# Patient Record
Sex: Female | Born: 1985 | Race: Black or African American | Hispanic: No | Marital: Single | Smoking: Current every day smoker
Health system: Southern US, Community
[De-identification: ages and names within clinical notes are randomized; demographics above are authoritative.]

## PROBLEM LIST (undated history)

## (undated) ENCOUNTER — Emergency Department (HOSPITAL_BASED_OUTPATIENT_CLINIC_OR_DEPARTMENT_OTHER): Admission: EM | Source: Home / Self Care

---

## 2009-06-08 ENCOUNTER — Emergency Department (HOSPITAL_BASED_OUTPATIENT_CLINIC_OR_DEPARTMENT_OTHER): Admission: EM | Admit: 2009-06-08 | Discharge: 2009-06-08 | Payer: Self-pay | Admitting: Emergency Medicine

## 2009-10-31 ENCOUNTER — Emergency Department (HOSPITAL_BASED_OUTPATIENT_CLINIC_OR_DEPARTMENT_OTHER): Admission: EM | Admit: 2009-10-31 | Discharge: 2009-10-31 | Payer: Self-pay | Admitting: Emergency Medicine

## 2010-07-28 ENCOUNTER — Emergency Department (INDEPENDENT_AMBULATORY_CARE_PROVIDER_SITE_OTHER): Payer: Self-pay

## 2010-07-28 ENCOUNTER — Emergency Department (HOSPITAL_BASED_OUTPATIENT_CLINIC_OR_DEPARTMENT_OTHER)
Admission: EM | Admit: 2010-07-28 | Discharge: 2010-07-28 | Disposition: A | Discharge status: Home / Self Care | Payer: Self-pay | Attending: Emergency Medicine | Admitting: Emergency Medicine

## 2010-07-28 DIAGNOSIS — R0602 Shortness of breath: Secondary | ICD-10-CM | POA: Insufficient documentation

## 2010-07-28 DIAGNOSIS — R0609 Other forms of dyspnea: Secondary | ICD-10-CM | POA: Insufficient documentation

## 2010-07-28 DIAGNOSIS — R0989 Other specified symptoms and signs involving the circulatory and respiratory systems: Secondary | ICD-10-CM | POA: Insufficient documentation

## 2010-07-28 DIAGNOSIS — F17200 Nicotine dependence, unspecified, uncomplicated: Secondary | ICD-10-CM | POA: Insufficient documentation

## 2010-09-06 ENCOUNTER — Emergency Department (HOSPITAL_BASED_OUTPATIENT_CLINIC_OR_DEPARTMENT_OTHER)
Admission: EM | Admit: 2010-09-06 | Discharge: 2010-09-06 | Disposition: A | Discharge status: Home / Self Care | Payer: Self-pay | Attending: Emergency Medicine | Admitting: Emergency Medicine

## 2010-09-06 DIAGNOSIS — N760 Acute vaginitis: Secondary | ICD-10-CM | POA: Insufficient documentation

## 2010-09-06 DIAGNOSIS — A499 Bacterial infection, unspecified: Secondary | ICD-10-CM | POA: Insufficient documentation

## 2010-09-06 DIAGNOSIS — F17200 Nicotine dependence, unspecified, uncomplicated: Secondary | ICD-10-CM | POA: Insufficient documentation

## 2010-09-06 DIAGNOSIS — N390 Urinary tract infection, site not specified: Secondary | ICD-10-CM | POA: Insufficient documentation

## 2010-09-06 DIAGNOSIS — B9689 Other specified bacterial agents as the cause of diseases classified elsewhere: Secondary | ICD-10-CM | POA: Insufficient documentation

## 2010-09-06 LAB — URINALYSIS, ROUTINE W REFLEX MICROSCOPIC
Bilirubin Urine: NEGATIVE
Glucose, UA: NEGATIVE mg/dL
Nitrite: NEGATIVE
Specific Gravity, Urine: 1.024 (ref 1.005–1.030)
pH: 7 (ref 5.0–8.0)

## 2010-09-06 LAB — URINE MICROSCOPIC-ADD ON

## 2010-09-06 LAB — WET PREP, GENITAL

## 2010-09-06 LAB — PREGNANCY, URINE: Preg Test, Ur: NEGATIVE

## 2011-03-17 ENCOUNTER — Emergency Department (HOSPITAL_BASED_OUTPATIENT_CLINIC_OR_DEPARTMENT_OTHER)
Admission: EM | Admit: 2011-03-17 | Discharge: 2011-03-17 | Discharge status: Against Medical Advice | Payer: Self-pay | Attending: Emergency Medicine | Admitting: Emergency Medicine

## 2011-03-17 ENCOUNTER — Encounter: Payer: Self-pay | Admitting: *Deleted

## 2011-03-17 DIAGNOSIS — N898 Other specified noninflammatory disorders of vagina: Secondary | ICD-10-CM | POA: Insufficient documentation

## 2011-03-17 NOTE — ED Notes (Signed)
Pt presents to ED today with vaginal c/o for the last day.  Pt reports change in laudry detergent.  Pt tried OTC care with no relief in sx

## 2011-03-19 ENCOUNTER — Emergency Department (HOSPITAL_BASED_OUTPATIENT_CLINIC_OR_DEPARTMENT_OTHER)
Admission: EM | Admit: 2011-03-19 | Discharge: 2011-03-19 | Disposition: A | Discharge status: Home / Self Care | Payer: Self-pay | Attending: Emergency Medicine | Admitting: Emergency Medicine

## 2011-03-19 ENCOUNTER — Encounter (HOSPITAL_BASED_OUTPATIENT_CLINIC_OR_DEPARTMENT_OTHER): Payer: Self-pay | Admitting: *Deleted

## 2011-03-19 DIAGNOSIS — A499 Bacterial infection, unspecified: Secondary | ICD-10-CM | POA: Insufficient documentation

## 2011-03-19 DIAGNOSIS — B9689 Other specified bacterial agents as the cause of diseases classified elsewhere: Secondary | ICD-10-CM | POA: Insufficient documentation

## 2011-03-19 DIAGNOSIS — F17200 Nicotine dependence, unspecified, uncomplicated: Secondary | ICD-10-CM | POA: Insufficient documentation

## 2011-03-19 DIAGNOSIS — N760 Acute vaginitis: Secondary | ICD-10-CM | POA: Insufficient documentation

## 2011-03-19 LAB — URINALYSIS, ROUTINE W REFLEX MICROSCOPIC
Bilirubin Urine: NEGATIVE
Glucose, UA: NEGATIVE mg/dL
Hgb urine dipstick: NEGATIVE
Ketones, ur: NEGATIVE mg/dL
Protein, ur: NEGATIVE mg/dL
pH: 6 (ref 5.0–8.0)

## 2011-03-19 LAB — WET PREP, GENITAL
Trich, Wet Prep: NONE SEEN
Yeast Wet Prep HPF POC: NONE SEEN

## 2011-03-19 LAB — URINE MICROSCOPIC-ADD ON

## 2011-03-19 LAB — RPR: RPR Ser Ql: NONREACTIVE

## 2011-03-19 MED ORDER — METRONIDAZOLE 500 MG PO TABS
500.0000 mg | ORAL_TABLET | Freq: Once | ORAL | Status: AC
  Administered 2011-03-19: 500 mg via ORAL
  Filled 2011-03-19: qty 1

## 2011-03-19 NOTE — ED Provider Notes (Signed)
History     CSN: [id number]  Arrival date & time 03/19/11  0758   First MD Initiated Contact with Patient 03/19/11 [phone]      Chief Complaint  Patient presents with  . Vaginal Itching  . Urinary Tract Infection    (Consider location/radiation/quality/duration/timing/severity/associated sxs/prior treatment) HPI Comments: Pt c/o vaginal itching x 2 days.  Denies dysuria, hematuria, frequency and urgency.  No back pain or fever.  No vaginal d/c.  No prior STD's.  Denies being sexually active.  "i'm gay".  The history is provided by the patient. No language interpreter was used.    History reviewed. No pertinent past medical history.  History reviewed. No pertinent past surgical history.  History reviewed. No pertinent family history.  History  Substance Use Topics  . Smoking status: Current Everyday Smoker -- 0.5 packs/day    Types: Cigarettes  . Smokeless tobacco: Not on file  . Alcohol Use: Yes     pt reports occasional use    OB History    Grav Para Term Preterm Abortions TAB SAB Ect Mult Living                  Review of Systems  Genitourinary: Negative for dysuria, urgency, frequency, hematuria, flank pain, vaginal bleeding, vaginal discharge and vaginal pain.    Allergies  Review of patient's allergies indicates no known allergies.  Home Medications  No current outpatient prescriptions on file.  BP 119/79  Pulse 63  Temp(Src) 98.2 F (36.8 C) (Oral)  Resp 16  Ht 5\' 4"  (1.626 m)  Wt 200 lb (90.719 kg)  BMI 34.33 kg/m2  SpO2 100%  LMP 03/10/2011  Physical Exam  Nursing note and vitals reviewed. Constitutional: She is oriented to person, place, and time. She appears well-developed and well-nourished. No distress.  HENT:  Head: Normocephalic and atraumatic.  Eyes: EOM are normal.  Neck: Normal range of motion.  Cardiovascular: Normal rate, regular rhythm and normal heart sounds.   Pulmonary/Chest: Effort normal and breath sounds normal.    Abdominal: Soft. She exhibits no distension. There is no hepatosplenomegaly. There is tenderness. There is no rigidity, no rebound, no guarding, no CVA tenderness, no tenderness at McBurney's point and negative Murphy's sign.    Genitourinary: There is erythema around the vagina. No bleeding around the vagina. No foreign body around the vagina. No signs of injury around the vagina. No vaginal discharge found.       Labia minora appear red and irritated.  No d/c.  Pt used the clotrimazole cream internally last PM  Musculoskeletal: Normal range of motion.  Neurological: She is alert and oriented to person, place, and time.  Skin: Skin is warm and dry.  Psychiatric: She has a normal mood and affect. Judgment normal.    ED Course  Procedures (including critical care time)   Labs Reviewed  URINALYSIS, ROUTINE W REFLEX MICROSCOPIC  GC/CHLAMYDIA PROBE AMP, GENITAL  RPR  WET PREP, GENITAL   No results found.   No diagnosis found.    MDM          Worthy Rancher, PA 03/19/11 1020

## 2011-03-19 NOTE — ED Notes (Signed)
Pt c/o vaginal itching and painful urination x 1 week

## 2011-03-19 NOTE — ED Provider Notes (Signed)
Medical screening examination/treatment/procedure(s) were performed by non-physician practitioner and as supervising physician I was immediately available for consultation/collaboration.   Garrin Kirwan A. Donyetta Ogletree, MD 03/19/11 1528 

## 2011-08-19 ENCOUNTER — Encounter (HOSPITAL_BASED_OUTPATIENT_CLINIC_OR_DEPARTMENT_OTHER): Payer: Self-pay | Admitting: Emergency Medicine

## 2011-08-19 ENCOUNTER — Emergency Department (HOSPITAL_BASED_OUTPATIENT_CLINIC_OR_DEPARTMENT_OTHER): Payer: Self-pay

## 2011-08-19 ENCOUNTER — Emergency Department (HOSPITAL_BASED_OUTPATIENT_CLINIC_OR_DEPARTMENT_OTHER)
Admission: EM | Admit: 2011-08-19 | Discharge: 2011-08-19 | Disposition: A | Discharge status: Home / Self Care | Payer: Self-pay | Attending: Emergency Medicine | Admitting: Emergency Medicine

## 2011-08-19 DIAGNOSIS — M79609 Pain in unspecified limb: Secondary | ICD-10-CM | POA: Insufficient documentation

## 2011-08-19 DIAGNOSIS — S0512XA Contusion of eyeball and orbital tissues, left eye, initial encounter: Secondary | ICD-10-CM

## 2011-08-19 DIAGNOSIS — M79601 Pain in right arm: Secondary | ICD-10-CM

## 2011-08-19 DIAGNOSIS — S0510XA Contusion of eyeball and orbital tissues, unspecified eye, initial encounter: Secondary | ICD-10-CM | POA: Insufficient documentation

## 2011-08-19 DIAGNOSIS — R220 Localized swelling, mass and lump, head: Secondary | ICD-10-CM | POA: Insufficient documentation

## 2011-08-19 MED ORDER — HYDROCODONE-ACETAMINOPHEN 5-325 MG PO TABS
1.0000 | ORAL_TABLET | Freq: Once | ORAL | Status: AC
  Administered 2011-08-19: 1 via ORAL
  Filled 2011-08-19: qty 1

## 2011-08-19 MED ORDER — IBUPROFEN 800 MG PO TABS
800.0000 mg | ORAL_TABLET | Freq: Once | ORAL | Status: AC
  Administered 2011-08-19: 800 mg via ORAL
  Filled 2011-08-19: qty 1

## 2011-08-19 MED ORDER — HYDROCODONE-IBUPROFEN 7.5-200 MG PO TABS: 1.0000 | ORAL_TABLET | Freq: Four times a day (QID) | ORAL | Status: AC | PRN

## 2011-08-19 MED ORDER — ONDANSETRON 4 MG PO TBDP
ORAL_TABLET | ORAL | Status: AC
  Administered 2011-08-19: 4 mg via ORAL
  Filled 2011-08-19: qty 1

## 2011-08-19 MED ORDER — ONDANSETRON 4 MG PO TBDP: 4.0000 mg | ORAL_TABLET | Freq: Three times a day (TID) | ORAL | Status: AC | PRN

## 2011-08-19 MED ORDER — ONDANSETRON 4 MG PO TBDP
4.0000 mg | ORAL_TABLET | Freq: Once | ORAL | Status: AC
  Administered 2011-08-19: 4 mg via ORAL
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
## 2011-08-19 NOTE — ED Notes (Signed)
Patient given handful of crackers and coke

## 2011-08-19 NOTE — Discharge Instructions (Signed)
Contusion A contusion is a deep bruise. Contusions are the result of an injury that caused bleeding under the skin. The contusion may turn blue, purple, or yellow. Minor injuries will give you a painless contusion, but more severe contusions may stay painful and swollen for a few weeks.  CAUSES  A contusion is usually caused by a blow, trauma, or direct force to an area of the body. SYMPTOMS   Swelling and redness of the injured area.   Bruising of the injured area.   Tenderness and soreness of the injured area.   Pain.  DIAGNOSIS  The diagnosis can be made by taking a history and physical exam. An X-ray, CT scan, or MRI may be needed to determine if there were any associated injuries, such as fractures. TREATMENT  Specific treatment will depend on what area of the body was injured. In general, the best treatment for a contusion is resting, icing, elevating, and applying cold compresses to the injured area. Over-the-counter medicines may also be recommended for pain control. Ask your caregiver what the best treatment is for your contusion. HOME CARE INSTRUCTIONS   Put ice on the injured area.   Put ice in a plastic bag.   Place a towel between your skin and the bag.   Leave the ice on for 15 to 20 minutes, 3 to 4 times a day.   Only take over-the-counter or prescription medicines for pain, discomfort, or fever as directed by your caregiver. Your caregiver may recommend avoiding anti-inflammatory medicines (aspirin, ibuprofen, and naproxen) for 48 hours because these medicines may increase bruising.   Rest the injured area.   If possible, elevate the injured area to reduce swelling.  SEEK IMMEDIATE MEDICAL CARE IF:   You have increased bruising or swelling.   You have pain that is getting worse.   Your swelling or pain is not relieved with medicines.  MAKE SURE YOU:   Understand these instructions.   Will watch your condition.   Will get help right away if you are not  doing well or get worse.  Document Released: 12/14/2004 Document Revised: 02/23/2011 Document Reviewed: 01/09/2011 [facility] Patient Information 2012 [place], [place].Cryotherapy Cryotherapy means treatment with cold. Ice or gel packs can be used to reduce both pain and swelling. Ice is the most helpful within the first 24 to 48 hours after an injury or flareup from overusing a muscle or joint. Sprains, strains, spasms, burning pain, shooting pain, and aches can all be eased with ice. Ice can also be used when recovering from surgery. Ice is effective, has very few side effects, and is safe for most people to use. PRECAUTIONS  Ice is not a safe treatment option for people with:  Raynaud's phenomenon. This is a condition affecting small blood vessels in the extremities. Exposure to cold may cause your problems to return.   Cold hypersensitivity. There are many forms of cold hypersensitivity, including:   Cold urticaria. Red, itchy hives appear on the skin when the tissues begin to warm after being iced.   Cold erythema. This is a red, itchy rash caused by exposure to cold.   Cold hemoglobinuria. Red blood cells break down when the tissues begin to warm after being iced. The hemoglobin that carry oxygen are passed into the urine because they cannot combine with blood proteins fast enough.   Numbness or altered sensitivity in the area being iced.  If you have any of the following conditions, do not use ice until you have discussed  cryotherapy with your caregiver:  Heart conditions, such as arrhythmia, angina, or chronic heart disease.   High blood pressure.   Healing wounds or open skin in the area being iced.   Current infections.   Rheumatoid arthritis.   Poor circulation.   Diabetes.  Ice slows the blood flow in the region it is applied. This is beneficial when trying to stop inflamed tissues from spreading irritating chemicals to surrounding tissues. However, if you expose your skin  to cold temperatures for too long or without the proper protection, you can damage your skin or nerves. Watch for signs of skin damage due to cold. HOME CARE INSTRUCTIONS Follow these tips to use ice and cold packs safely.  Place a dry or damp towel between the ice and skin. A damp towel will cool the skin more quickly, so you may need to shorten the time that the ice is used.   For a more rapid response, add gentle compression to the ice.   Ice for no more than 10 to 20 minutes at a time. The bonier the area you are icing, the less time it will take to get the benefits of ice.   Check your skin after 5 minutes to make sure there are no signs of a poor response to cold or skin damage.   Rest 20 minutes or more in between uses.   Once your skin is numb, you can end your treatment. You can test numbness by very lightly touching your skin. The touch should be so light that you do not see the skin dimple from the pressure of your fingertip. When using ice, most people will feel these normal sensations in this order: cold, burning, aching, and numbness.   Do not use ice on someone who cannot communicate their responses to pain, such as small children or people with dementia.  HOW TO MAKE AN ICE PACK Ice packs are the most common way to use ice therapy. Other methods include ice massage, ice baths, and cryo-sprays. Muscle creams that cause a cold, tingly feeling do not offer the same benefits that ice offers and should not be used as a substitute unless recommended by your caregiver. To make an ice pack, do one of the following:  Place crushed ice or a bag of frozen vegetables in a sealable plastic bag. Squeeze out the excess air. Place this bag inside another plastic bag. Slide the bag into a pillowcase or place a damp towel between your skin and the bag.   Mix 3 parts water with 1 part rubbing alcohol. Freeze the mixture in a sealable plastic bag. When you remove the mixture from the freezer, it  will be slushy. Squeeze out the excess air. Place this bag inside another plastic bag. Slide the bag into a pillowcase or place a damp towel between your skin and the bag.  SEEK MEDICAL CARE IF:  You develop white spots on your skin. This may give the skin a blotchy (mottled) appearance.   Your skin turns blue or pale.   Your skin becomes waxy or hard.   Your swelling gets worse.  MAKE SURE YOU:   Understand these instructions.   Will watch your condition.   Will get help right away if you are not doing well or get worse.  Document Released: 10/31/2010 Document Revised: 02/23/2011 Document Reviewed: 10/31/2010 [facility] Patient Information 2012 [place], [place].Contusion A contusion is a deep bruise. Contusions are the result of an injury that caused bleeding under the  skin. The contusion may turn blue, purple, or yellow. Minor injuries will give you a painless contusion, but more severe contusions may stay painful and swollen for a few weeks.  CAUSES  A contusion is usually caused by a blow, trauma, or direct force to an area of the body. SYMPTOMS   Swelling and redness of the injured area.   Bruising of the injured area.   Tenderness and soreness of the injured area.   Pain.  DIAGNOSIS  The diagnosis can be made by taking a history and physical exam. An X-ray, CT scan, or MRI may be needed to determine if there were any associated injuries, such as fractures. TREATMENT  Specific treatment will depend on what area of the body was injured. In general, the best treatment for a contusion is resting, icing, elevating, and applying cold compresses to the injured area. Over-the-counter medicines may also be recommended for pain control. Ask your caregiver what the best treatment is for your contusion. HOME CARE INSTRUCTIONS   Put ice on the injured area.   Put ice in a plastic bag.   Place a towel between your skin and the bag.   Leave the ice on for 15 to 20 minutes, 3 to 4 times  a day.   Only take over-the-counter or prescription medicines for pain, discomfort, or fever as directed by your caregiver. Your caregiver may recommend avoiding anti-inflammatory medicines (aspirin, ibuprofen, and naproxen) for 48 hours because these medicines may increase bruising.   Rest the injured area.   If possible, elevate the injured area to reduce swelling.  SEEK IMMEDIATE MEDICAL CARE IF:   You have increased bruising or swelling.   You have pain that is getting worse.   Your swelling or pain is not relieved with medicines.  MAKE SURE YOU:   Understand these instructions.   Will watch your condition.   Will get help right away if you are not doing well or get worse.  Document Released: 12/14/2004 Document Revised: 02/23/2011 Document Reviewed: 01/09/2011 [facility] Patient Information 2012 [place], [place].    Take the meds as directed.  Take ibuprofen 800 mg every 8 hrswith food.  Apply ice several times daily.  Return if your symptoms worsen or change significantly.
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
## 2011-08-20 NOTE — ED Provider Notes (Signed)
Medical screening examination/treatment/procedure(s) were performed by non-physician practitioner and as supervising physician I was immediately available for consultation/collaboration.   Stephenia Vogan B. Bernette Mayers, MD 08/20/11 [phone]

## 2011-08-23 ENCOUNTER — Emergency Department (HOSPITAL_BASED_OUTPATIENT_CLINIC_OR_DEPARTMENT_OTHER)
Admission: EM | Admit: 2011-08-23 | Discharge: 2011-08-23 | Disposition: A | Discharge status: Home / Self Care | Payer: Self-pay | Attending: Emergency Medicine | Admitting: Emergency Medicine

## 2011-08-23 ENCOUNTER — Encounter (HOSPITAL_BASED_OUTPATIENT_CLINIC_OR_DEPARTMENT_OTHER): Payer: Self-pay | Admitting: Emergency Medicine

## 2011-08-23 DIAGNOSIS — A499 Bacterial infection, unspecified: Secondary | ICD-10-CM | POA: Insufficient documentation

## 2011-08-23 DIAGNOSIS — N760 Acute vaginitis: Secondary | ICD-10-CM | POA: Insufficient documentation

## 2011-08-23 DIAGNOSIS — B9689 Other specified bacterial agents as the cause of diseases classified elsewhere: Secondary | ICD-10-CM | POA: Insufficient documentation

## 2011-08-23 DIAGNOSIS — N390 Urinary tract infection, site not specified: Secondary | ICD-10-CM | POA: Insufficient documentation

## 2011-08-23 DIAGNOSIS — F17200 Nicotine dependence, unspecified, uncomplicated: Secondary | ICD-10-CM | POA: Insufficient documentation

## 2011-08-23 LAB — GC/CHLAMYDIA PROBE AMP, GENITAL
Chlamydia, DNA Probe: NEGATIVE
GC Probe Amp, Genital: NEGATIVE

## 2011-08-23 LAB — URINALYSIS, ROUTINE W REFLEX MICROSCOPIC
Bilirubin Urine: NEGATIVE
Hgb urine dipstick: NEGATIVE
Specific Gravity, Urine: 1.031 — ABNORMAL HIGH (ref 1.005–1.030)
pH: 5.5 (ref 5.0–8.0)

## 2011-08-23 LAB — WET PREP, GENITAL: Trich, Wet Prep: NONE SEEN

## 2011-08-23 LAB — URINE MICROSCOPIC-ADD ON

## 2011-08-23 MED ORDER — METRONIDAZOLE 500 MG PO TABS
500.0000 mg | ORAL_TABLET | Freq: Once | ORAL | Status: AC
  Administered 2011-08-23: 500 mg via ORAL
  Filled 2011-08-23: qty 1

## 2011-08-23 MED ORDER — NITROFURANTOIN MONOHYD MACRO 100 MG PO CAPS: 100.0000 mg | ORAL_CAPSULE | Freq: Two times a day (BID) | ORAL | Status: AC

## 2011-08-23 MED ORDER — NITROFURANTOIN MONOHYD MACRO 100 MG PO CAPS
100.0000 mg | ORAL_CAPSULE | Freq: Once | ORAL | Status: AC
  Administered 2011-08-23: 100 mg via ORAL
  Filled 2011-08-23: qty 1

## 2011-08-23 MED ORDER — METRONIDAZOLE 500 MG PO TABS: 500.0000 mg | ORAL_TABLET | Freq: Two times a day (BID) | ORAL | Status: AC
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
## 2011-08-23 NOTE — ED Notes (Signed)
Pt c/o lower abd pain and vaginal discharge.

## 2011-08-23 NOTE — ED Notes (Signed)
Seen here 08/19/11 and received rx for vicoprofen and zofran. Pt states she didn't get those prescriptions filled.

## 2011-08-24 LAB — URINE CULTURE: Culture  Setup Time: 201306050619

## 2011-09-05 ENCOUNTER — Emergency Department (HOSPITAL_BASED_OUTPATIENT_CLINIC_OR_DEPARTMENT_OTHER)
Admission: EM | Admit: 2011-09-05 | Discharge: 2011-09-05 | Disposition: A | Discharge status: Home / Self Care | Payer: Self-pay | Attending: Emergency Medicine | Admitting: Emergency Medicine

## 2011-09-05 ENCOUNTER — Encounter (HOSPITAL_BASED_OUTPATIENT_CLINIC_OR_DEPARTMENT_OTHER): Payer: Self-pay | Admitting: *Deleted

## 2011-09-05 DIAGNOSIS — R112 Nausea with vomiting, unspecified: Secondary | ICD-10-CM | POA: Insufficient documentation

## 2011-09-05 DIAGNOSIS — R110 Nausea: Secondary | ICD-10-CM

## 2011-09-05 DIAGNOSIS — F17200 Nicotine dependence, unspecified, uncomplicated: Secondary | ICD-10-CM | POA: Insufficient documentation

## 2011-09-05 MED ORDER — ONDANSETRON 4 MG PO TBDP
4.0000 mg | ORAL_TABLET | Freq: Once | ORAL | Status: AC
  Administered 2011-09-05: 4 mg via ORAL
  Filled 2011-09-05: qty 1

## 2011-09-05 MED ORDER — PROMETHAZINE HCL 25 MG PO TABS: 25.0000 mg | ORAL_TABLET | Freq: Four times a day (QID) | ORAL | Status: DC | PRN

## 2011-09-05 NOTE — Discharge Instructions (Signed)
Nausea and Vomiting  Nausea is a sick feeling that often comes before throwing up (vomiting). Vomiting is a reflex where stomach contents come out of your mouth. Vomiting can cause severe loss of body fluids (dehydration). Children and elderly adults can become dehydrated quickly, especially if they also have diarrhea. Nausea and vomiting are symptoms of a condition or disease. It is important to find the cause of your symptoms.  CAUSES    Direct irritation of the stomach lining. This irritation can result from increased acid production (gastroesophageal reflux disease), infection, food poisoning, taking certain medicines (such as nonsteroidal anti-inflammatory drugs), alcohol use, or tobacco use.   Signals from the brain.These signals could be caused by a headache, heat exposure, an inner ear disturbance, increased pressure in the brain from injury, infection, a tumor, or a concussion, pain, emotional stimulus, or metabolic problems.   An obstruction in the gastrointestinal tract (bowel obstruction).   Illnesses such as diabetes, hepatitis, gallbladder problems, appendicitis, kidney problems, cancer, sepsis, atypical symptoms of a heart attack, or eating disorders.   Medical treatments such as chemotherapy and radiation.   Receiving medicine that makes you sleep (general anesthetic) during surgery.  DIAGNOSIS  Your caregiver may ask for tests to be done if the problems do not improve after a few days. Tests may also be done if symptoms are severe or if the reason for the nausea and vomiting is not clear. Tests may include:   Urine tests.   Blood tests.   Stool tests.   Cultures (to look for evidence of infection).   X-rays or other imaging studies.  Test results can help your caregiver make decisions about treatment or the need for additional tests.  TREATMENT  You need to stay well hydrated. Drink frequently but in small amounts.You may wish to drink water, sports drinks, clear broth, or eat frozen  ice pops or gelatin dessert to help stay hydrated.When you eat, eating slowly may help prevent nausea.There are also some antinausea medicines that may help prevent nausea.  HOME CARE INSTRUCTIONS    Take all medicine as directed by your caregiver.   If you do not have an appetite, do not force yourself to eat. However, you must continue to drink fluids.   If you have an appetite, eat a normal diet unless your caregiver tells you differently.   Eat a variety of complex carbohydrates (rice, wheat, potatoes, bread), lean meats, yogurt, fruits, and vegetables.   Avoid high-fat foods because they are more difficult to digest.   Drink enough water and fluids to keep your urine clear or pale yellow.   If you are dehydrated, ask your caregiver for specific rehydration instructions. Signs of dehydration may include:   Severe thirst.   Dry lips and mouth.   Dizziness.   Dark urine.   Decreasing urine frequency and amount.   Confusion.   Rapid breathing or pulse.  SEEK IMMEDIATE MEDICAL CARE IF:    You have blood or brown flecks (like coffee grounds) in your vomit.   You have black or bloody stools.   You have a severe headache or stiff neck.   You are confused.   You have severe abdominal pain.   You have chest pain or trouble breathing.   You do not urinate at least once every 8 hours.   You develop cold or clammy skin.   You continue to vomit for longer than 24 to 48 hours.   You have a fever.  MAKE SURE YOU:      Understand these instructions.   Will watch your condition.   Will get help right away if you are not doing well or get worse.  Document Released: 03/06/2005 Document Revised: 02/23/2011 Document Reviewed: 08/03/2010  [employer] Patient Information 2012 [employer].

## 2011-09-05 NOTE — ED Provider Notes (Signed)
Medical screening examination/treatment/procedure(s) were performed by non-physician practitioner and as supervising physician I was immediately available for consultation/collaboration.    Nelia Shi, MD 09/05/11 1414

## 2011-09-05 NOTE — ED Provider Notes (Signed)
History     CSN: [id number]  Arrival date & time 09/05/11  1314   First MD Initiated Contact with Patient 09/05/11 1348      Chief Complaint  Patient presents with  . Nausea    (Consider location/radiation/quality/duration/timing/severity/associated sxs/prior treatment) Patient is a 26 y.o. female presenting with vomiting. The history is provided by the patient. No language interpreter was used.  Emesis  This is a new problem. The current episode started more than 2 days ago. The problem occurs 2 to 4 times per day. The problem has not changed since onset. Pt reports she becomes nauseated and feels like she is going to vomit after she takes flagyl and macrobid  History reviewed. No pertinent past medical history.  History reviewed. No pertinent past surgical history.  History reviewed. No pertinent family history.  History  Substance Use Topics  . Smoking status: Current Everyday Smoker -- 0.5 packs/day    Types: Cigarettes  . Smokeless tobacco: Not on file  . Alcohol Use: Yes     pt reports occasional use    OB History    Grav Para Term Preterm Abortions TAB SAB Ect Mult Living                  Review of Systems  Gastrointestinal: Positive for vomiting.  All other systems reviewed and are negative.    Allergies  Review of patient's allergies indicates no known allergies.  Home Medications  No current outpatient prescriptions on file.  BP 141/84  Pulse 65  Temp 97.7 F (36.5 C) (Oral)  Resp 16  Ht 5\' 4"  (1.626 m)  Wt 220 lb (99.791 kg)  BMI 37.76 kg/m2  SpO2 100%  LMP 07/28/2011  Physical Exam  Nursing note and vitals reviewed. Constitutional: She is oriented to person, place, and time. She appears well-developed and well-nourished.  HENT:  Head: Normocephalic and atraumatic.  Right Ear: External ear normal.  Left Ear: External ear normal.  Nose: Nose normal.  Mouth/Throat: Oropharynx is clear and moist.  Eyes: Conjunctivae and EOM are normal.  Pupils are equal, round, and reactive to light.  Neck: Normal range of motion. Neck supple.  Cardiovascular: Normal rate and normal heart sounds.   Pulmonary/Chest: Effort normal and breath sounds normal.  Abdominal: Soft. Bowel sounds are normal.  Neurological: She is alert and oriented to person, place, and time. She has normal reflexes.  Skin: Skin is warm.  Psychiatric: She has a normal mood and affect.    ED Course  Procedures (including critical care time)  Labs Reviewed - No data to display No results found.   1. Nausea       MDM  Pt given zofran here.  rx for phenergan.  Pt has 2 days left of medication.   I advised her that she needs to finish macrobid.        Lonia Skinner [place], [place] 09/05/11 1359  Lonia Skinner [place], [place] 09/05/11 1401

## 2011-09-05 NOTE — ED Notes (Signed)
Pt seen here a week ago, prescribed macrobid and flagyl, pt states when she takes meds , she gets nausea

## 2012-01-28 ENCOUNTER — Emergency Department (HOSPITAL_BASED_OUTPATIENT_CLINIC_OR_DEPARTMENT_OTHER)
Admission: EM | Admit: 2012-01-28 | Discharge: 2012-01-28 | Disposition: A | Discharge status: Home / Self Care | Payer: Self-pay | Attending: Emergency Medicine | Admitting: Emergency Medicine

## 2012-01-28 ENCOUNTER — Encounter (HOSPITAL_BASED_OUTPATIENT_CLINIC_OR_DEPARTMENT_OTHER): Payer: Self-pay

## 2012-01-28 DIAGNOSIS — A6000 Herpesviral infection of urogenital system, unspecified: Secondary | ICD-10-CM | POA: Insufficient documentation

## 2012-01-28 DIAGNOSIS — F17200 Nicotine dependence, unspecified, uncomplicated: Secondary | ICD-10-CM | POA: Insufficient documentation

## 2012-01-28 DIAGNOSIS — R300 Dysuria: Secondary | ICD-10-CM | POA: Insufficient documentation

## 2012-01-28 LAB — URINALYSIS, ROUTINE W REFLEX MICROSCOPIC
Bilirubin Urine: NEGATIVE
Leukocytes, UA: NEGATIVE
Nitrite: NEGATIVE
Specific Gravity, Urine: 1.034 — ABNORMAL HIGH (ref 1.005–1.030)
pH: 5.5 (ref 5.0–8.0)

## 2012-01-28 LAB — WET PREP, GENITAL
Trich, Wet Prep: NONE SEEN
Yeast Wet Prep HPF POC: NONE SEEN

## 2012-01-28 LAB — PREGNANCY, URINE: Preg Test, Ur: NEGATIVE

## 2012-01-28 MED ORDER — ACYCLOVIR 400 MG PO TABS: 400.0000 mg | ORAL_TABLET | Freq: Three times a day (TID) | ORAL | Status: DC

## 2012-01-28 NOTE — ED Notes (Signed)
Patient reports that she has painful swollen area to labia x 2 days, denies drainage. Also would like to be checked for diabetes, reports increased thirst and urination

## 2012-01-28 NOTE — ED Provider Notes (Signed)
History     CSN: [id number]  Arrival date & time 01/28/12  4098   First MD Initiated Contact with Patient 01/28/12 0430      Chief Complaint  Patient presents with  . possible abcess     (Consider location/radiation/quality/duration/timing/severity/associated sxs/prior treatment) Patient is a 26 y.o. female presenting with rash.  Rash  This is a new problem. The current episode started 2 days ago. The problem has been gradually worsening. The problem is associated with nothing. There has been no fever. Affected Location: labia. The pain is severe. The pain has been constant since onset. Associated symptoms include pain. Pertinent negatives include no itching. She has tried nothing for the symptoms. The treatment provided no relief.    History reviewed. No pertinent past medical history.  History reviewed. No pertinent past surgical history.  No family history on file.  History  Substance Use Topics  . Smoking status: Current Every Day Smoker -- 0.5 packs/day    Types: Cigarettes  . Smokeless tobacco: Not on file  . Alcohol Use: Yes     Comment: pt reports occasional use    OB History    Grav Para Term Preterm Abortions TAB SAB Ect Mult Living                  Review of Systems  Genitourinary: Positive for dysuria.  Skin: Positive for rash. Negative for itching.  All other systems reviewed and are negative.    Allergies  Review of patient's allergies indicates no known allergies.  Home Medications   Current Outpatient Rx  Name  Route  Sig  Dispense  Refill  . ACYCLOVIR 400 MG PO TABS   Oral   Take 1 tablet (400 mg total) by mouth 3 (three) times daily.   30 tablet   0     BP 134/92  Pulse 73  Temp 98.6 F (37 C) (Oral)  Resp 18  SpO2 100%  Physical Exam  Constitutional: She is oriented to person, place, and time. She appears well-developed and well-nourished.  HENT:  Head: Normocephalic and atraumatic.  Eyes: Pupils are equal, round, and  reactive to light.  Neck: Normal range of motion.  Pulmonary/Chest: Effort normal and breath sounds normal.  Abdominal: Soft. Bowel sounds are normal. There is no tenderness. There is no rebound.  Genitourinary: No vaginal discharge found.       Cluster of vesicles on the right labia.  Chaperone present.    Musculoskeletal: Normal range of motion.  Neurological: She is alert and oriented to person, place, and time.  Skin: Skin is warm and dry.       See GYN exam  Psychiatric: She has a normal mood and affect.    ED Course  Procedures (including critical care time)  Labs Reviewed  URINALYSIS, ROUTINE W REFLEX MICROSCOPIC - Abnormal; Notable for the following:    APPearance CLOUDY (*)     Specific Gravity, Urine 1.034 (*)     All other components within normal limits  WET PREP, GENITAL - Abnormal; Notable for the following:    WBC, Wet Prep HPF POC FEW (*)     All other components within normal limits  PREGNANCY, URINE  GC/CHLAMYDIA PROBE AMP   No results found.   1. Genital herpes       MDM  Vesicles c/w herpes.  No sexual activity.  Will treat for herpes [facility]        Mitchell Iwanicki K  Tylee Newby-Rasch, MD 01/28/12 [phone]

## 2012-01-29 LAB — GC/CHLAMYDIA PROBE AMP: CT Probe RNA: NEGATIVE

## 2012-03-23 ENCOUNTER — Emergency Department (HOSPITAL_BASED_OUTPATIENT_CLINIC_OR_DEPARTMENT_OTHER)
Admission: EM | Admit: 2012-03-23 | Discharge: 2012-03-23 | Disposition: A | Discharge status: Home / Self Care | Payer: Self-pay | Attending: Emergency Medicine | Admitting: Emergency Medicine

## 2012-03-23 ENCOUNTER — Encounter (HOSPITAL_BASED_OUTPATIENT_CLINIC_OR_DEPARTMENT_OTHER): Payer: Self-pay | Admitting: *Deleted

## 2012-03-23 ENCOUNTER — Emergency Department (HOSPITAL_BASED_OUTPATIENT_CLINIC_OR_DEPARTMENT_OTHER): Payer: Self-pay

## 2012-03-23 DIAGNOSIS — R51 Headache: Secondary | ICD-10-CM | POA: Insufficient documentation

## 2012-03-23 DIAGNOSIS — R42 Dizziness and giddiness: Secondary | ICD-10-CM | POA: Insufficient documentation

## 2012-03-23 DIAGNOSIS — Z79899 Other long term (current) drug therapy: Secondary | ICD-10-CM | POA: Insufficient documentation

## 2012-03-23 DIAGNOSIS — F17200 Nicotine dependence, unspecified, uncomplicated: Secondary | ICD-10-CM | POA: Insufficient documentation

## 2012-03-23 LAB — CBC WITH DIFFERENTIAL/PLATELET
Basophils Relative: 0 % (ref 0–1)
Eosinophils Absolute: 0 10*3/uL (ref 0.0–0.7)
Eosinophils Relative: 0 % (ref 0–5)
HCT: 39.9 % (ref 36.0–46.0)
Hemoglobin: 13.5 g/dL (ref 12.0–15.0)
Lymphs Abs: 2.3 10*3/uL (ref 0.7–4.0)
MCH: 29.3 pg (ref 26.0–34.0)
MCHC: 33.8 g/dL (ref 30.0–36.0)
MCV: 86.7 fL (ref 78.0–100.0)
Monocytes Absolute: 0.6 10*3/uL (ref 0.1–1.0)
Monocytes Relative: 7 % (ref 3–12)

## 2012-03-23 LAB — URINALYSIS, ROUTINE W REFLEX MICROSCOPIC
Bilirubin Urine: NEGATIVE
Ketones, ur: NEGATIVE mg/dL
Nitrite: NEGATIVE
Urobilinogen, UA: 0.2 mg/dL (ref 0.0–1.0)

## 2012-03-23 LAB — URINE MICROSCOPIC-ADD ON

## 2012-03-23 LAB — BASIC METABOLIC PANEL
BUN: 12 mg/dL (ref 6–23)
Creatinine, Ser: 0.8 mg/dL (ref 0.50–1.10)
GFR calc Af Amer: >90 mL/min (ref >90)
GFR calc non Af Amer: >90 mL/min (ref >90)
Glucose, Bld: 79 mg/dL (ref 70–99)

## 2012-03-23 MED ORDER — DIPHENHYDRAMINE HCL 50 MG/ML IJ SOLN
25.0000 mg | Freq: Once | INTRAMUSCULAR | Status: AC
  Administered 2012-03-23: 25 mg via INTRAVENOUS
  Filled 2012-03-23: qty 1

## 2012-03-23 MED ORDER — METOCLOPRAMIDE HCL 10 MG PO TABS: 10.0000 mg | ORAL_TABLET | Freq: Four times a day (QID) | ORAL | Status: DC | PRN

## 2012-03-23 MED ORDER — METOCLOPRAMIDE HCL 5 MG/ML IJ SOLN
10.0000 mg | Freq: Once | INTRAMUSCULAR | Status: AC
  Administered 2012-03-23: 10 mg via INTRAVENOUS
  Filled 2012-03-23: qty 2

## 2012-03-23 MED ORDER — SODIUM CHLORIDE 0.9 % IV BOLUS (SEPSIS)
1000.0000 mL | Freq: Once | INTRAVENOUS | Status: AC
  Administered 2012-03-23: 1000 mL via INTRAVENOUS

## 2012-03-23 NOTE — ED Notes (Signed)
Pt c/o h/a and dizziness x 2 weeks

## 2012-03-23 NOTE — ED Provider Notes (Signed)
History   This chart was scribed for Leah Booze, MD scribed by Magnus Sinning. The patient was seen in room MH03/[facility]03 at 15:41   CSN: [id number]  Arrival date & time 03/23/12  1507   First MD Initiated Contact with Patient 03/23/12 1539      Chief Complaint  Patient presents with  . Headache    (Consider location/radiation/quality/duration/timing/severity/associated sxs/prior treatment) Patient is a 27 y.o. female presenting with headaches. The history is provided by the patient. No language interpreter was used.  Headache  Pertinent negatives include no nausea and no vomiting.   Leah Good is a 27 y.o. female who presents to the Emergency Department complaining of gradually worsened moderate generalized HA with associated dizziness, both ongoing for 3-4 months.  The patient rates HA a 10 of 10 on pain scale and does not provide any aggravating or modifying factors. She states she's had one episode of emesis, but denies nausea. Also says that she has been sleeping moderately well, but does state HA has once woken her up.   The patient reports the dizziness is brought on when she stands up,and she provides she has taken ibuprofen and tylenol for HA with no relief. She states last night her eyes were hurting, and she notes an uncle that died that had similar sxs with hx of HTN, so she was concerned and wanted to present to ED for EVAL.  Patient states she smokes one pack a day, uses marijuana, and that she is an occasional drinker. The patient reports that she does not have sex with female partners, thus not on any birth control.   PCP: None History reviewed. No pertinent past medical history.  History reviewed. No pertinent past surgical history.  History reviewed. No pertinent family history.  History  Substance Use Topics  . Smoking status: Current Every Day Smoker -- 0.5 packs/day    Types: Cigarettes  . Smokeless tobacco: Not on file  . Alcohol Use: Yes     Comment: pt  reports occasional use    Review of Systems  Gastrointestinal: Negative for nausea and vomiting.  Neurological: Positive for dizziness and headaches.  All other systems reviewed and are negative.    Allergies  Review of patient's allergies indicates no known allergies.  Home Medications   Current Outpatient Rx  Name  Route  Sig  Dispense  Refill  . ACYCLOVIR 400 MG PO TABS   Oral   Take 1 tablet (400 mg total) by mouth 3 (three) times daily.   30 tablet   0     BP 161/85  Pulse 79  Temp 98.8 F (37.1 C) (Oral)  Resp 16  Ht 5\' 4"  (1.626 m)  Wt 206 lb (93.441 kg)  BMI 35.36 kg/m2  SpO2 99%  LMP 03/16/2012  Physical Exam  Nursing note and vitals reviewed. Constitutional: She is oriented to person, place, and time. She appears well-developed and well-nourished. No distress.  HENT:  Head: Normocephalic and atraumatic.  Eyes: Conjunctivae normal and EOM are normal. Pupils are equal, round, and reactive to light.       Fundi are nml.  Neck: Neck supple. No tracheal deviation present.  Cardiovascular: Normal rate, regular rhythm and normal heart sounds.   Pulmonary/Chest: Effort normal. No respiratory distress. She has no wheezes. She has no rales.  Abdominal: She exhibits no distension.  Musculoskeletal: Normal range of motion.  Neurological: She is alert and oriented to person, place, and time. No sensory deficit.  Skin: Skin is  warm and dry.  Psychiatric: She has a normal mood and affect. Her behavior is normal.    ED Course  Procedures (including critical care time) 15:45: Physical exam performed. 17:12: Recheck performed and patient notes headache has resolved after treatment.  Results for orders placed during the hospital encounter of 03/23/12  CBC WITH DIFFERENTIAL      Component Value Range   WBC 8.4  4.0 - 10.5 K/uL   RBC 4.60  3.87 - 5.11 MIL/uL   Hemoglobin 13.5  12.0 - 15.0 g/dL   HCT 11.9  14.7 - 82.9 %   MCV 86.7  78.0 - 100.0 fL   MCH 29.3  26.0  - 34.0 pg   MCHC 33.8  30.0 - 36.0 g/dL   RDW 56.2  13.0 - 86.5 %   Platelets 346  150 - 400 K/uL   Neutrophils Relative 66  43 - 77 %   Neutro Abs 5.6  1.7 - 7.7 K/uL   Lymphocytes Relative 27  12 - 46 %   Lymphs Abs 2.3  0.7 - 4.0 K/uL   Monocytes Relative 7  3 - 12 %   Monocytes Absolute 0.6  0.1 - 1.0 K/uL   Eosinophils Relative 0  0 - 5 %   Eosinophils Absolute 0.0  0.0 - 0.7 K/uL   Basophils Relative 0  0 - 1 %   Basophils Absolute 0.0  0.0 - 0.1 K/uL  BASIC METABOLIC PANEL      Component Value Range   Sodium 138  135 - 145 mEq/L   Potassium 3.5  3.5 - 5.1 mEq/L   Chloride 101  96 - 112 mEq/L   CO2 25  19 - 32 mEq/L   Glucose, Bld 79  70 - 99 mg/dL   BUN 12  6 - 23 mg/dL   Creatinine, Ser 7.84  0.50 - 1.10 mg/dL   Calcium 9.7  8.4 - 69.6 mg/dL   GFR calc non Af Amer >90  >90 mL/min   GFR calc Af Amer >90  >90 mL/min  URINALYSIS, ROUTINE W REFLEX MICROSCOPIC      Component Value Range   Color, Urine YELLOW  YELLOW   APPearance CLOUDY (*) CLEAR   Specific Gravity, Urine 1.027  1.005 - 1.030   pH 6.0  5.0 - 8.0   Glucose, UA NEGATIVE  NEGATIVE mg/dL   Hgb urine dipstick LARGE (*) NEGATIVE   Bilirubin Urine NEGATIVE  NEGATIVE   Ketones, ur NEGATIVE  NEGATIVE mg/dL   Protein, ur NEGATIVE  NEGATIVE mg/dL   Urobilinogen, UA 0.2  0.0 - 1.0 mg/dL   Nitrite NEGATIVE  NEGATIVE   Leukocytes, UA MODERATE (*) NEGATIVE  PREGNANCY, URINE      Component Value Range   Preg Test, Ur NEGATIVE  NEGATIVE  URINE MICROSCOPIC-ADD ON      Component Value Range   Squamous Epithelial / LPF MANY (*) RARE   WBC, UA 3-6  <3 WBC/hpf   RBC / HPF 7-10  <3 RBC/hpf   Bacteria, UA MANY (*) RARE   Ct Head Wo Contrast  03/23/2012  *RADIOLOGY REPORT*  Clinical Data:  Headache  CT HEAD WITHOUT CONTRAST  Technique:  Contiguous axial images were obtained from the base of the skull through the vertex without contrast  Comparison:  None.  Findings:  The brain has a normal appearance without evidence for  hemorrhage, acute infarction, hydrocephalus, or mass lesion.  There is no extra axial fluid collection.  The skull  and paranasal sinuses are normal.  IMPRESSION: Normal CT of the head without contrast.   Original Report Authenticated By: Janeece Riggers, M.D.      1. Headache       MDM  Headache of uncertain cause. The pattern seems generally benign. Although the patient claims a headache 10/10 severity, she is casually talking on the phone and asking if she can have something to eat and does not appear to be in any distress whatsoever. Mild to moderate hypertension is noted and will need to be followed with additional readings as an outpatient. She'll be given headache cocktail and reassessed. Because of how long the headache has been going on, CT scan will be obtained.  CT is unremarkable and she got excellent relief of her headache with IV fluids, IV metoclopramide, and IV diphenhydramine. She'll be sent home with a prescription for metoclopramide. Urinalysis has come back with an obviously contaminated specimen with many epithelial cells. She has no symptoms suggestive of urinary tract infection, so we'll not be put on antibiotics.  I personally performed the services described in this documentation, which was scribed in my presence. The recorded information has been reviewed and is accurate.          Leah Booze, MD 03/23/12 [phone]

## 2012-03-25 LAB — URINE CULTURE: Colony Count: 60000
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
## 2014-05-22 ENCOUNTER — Encounter (HOSPITAL_BASED_OUTPATIENT_CLINIC_OR_DEPARTMENT_OTHER): Payer: Self-pay

## 2014-05-22 ENCOUNTER — Emergency Department (HOSPITAL_BASED_OUTPATIENT_CLINIC_OR_DEPARTMENT_OTHER)
Admission: EM | Admit: 2014-05-22 | Discharge: 2014-05-22 | Disposition: A | Discharge status: Home / Self Care | Payer: Self-pay | Attending: Emergency Medicine | Admitting: Emergency Medicine

## 2014-05-22 DIAGNOSIS — N72 Inflammatory disease of cervix uteri: Secondary | ICD-10-CM | POA: Insufficient documentation

## 2014-05-22 DIAGNOSIS — Z720 Tobacco use: Secondary | ICD-10-CM | POA: Insufficient documentation

## 2014-05-22 DIAGNOSIS — Z79899 Other long term (current) drug therapy: Secondary | ICD-10-CM | POA: Insufficient documentation

## 2014-05-22 DIAGNOSIS — Z3202 Encounter for pregnancy test, result negative: Secondary | ICD-10-CM | POA: Insufficient documentation

## 2014-05-22 LAB — URINALYSIS, ROUTINE W REFLEX MICROSCOPIC
BILIRUBIN URINE: NEGATIVE
Glucose, UA: NEGATIVE mg/dL
KETONES UR: NEGATIVE mg/dL
Nitrite: NEGATIVE
PROTEIN: NEGATIVE mg/dL
Specific Gravity, Urine: 1.018 (ref 1.005–1.030)
UROBILINOGEN UA: 0.2 mg/dL (ref 0.0–1.0)
pH: 7 (ref 5.0–8.0)

## 2014-05-22 LAB — WET PREP, GENITAL
TRICH WET PREP: NONE SEEN
Yeast Wet Prep HPF POC: NONE SEEN

## 2014-05-22 LAB — PREGNANCY, URINE: PREG TEST UR: NEGATIVE

## 2014-05-22 LAB — URINE MICROSCOPIC-ADD ON

## 2014-05-22 MED ORDER — AZITHROMYCIN 250 MG PO TABS
1000.0000 mg | ORAL_TABLET | Freq: Once | ORAL | Status: AC
  Administered 2014-05-22: 1000 mg via ORAL
  Filled 2014-05-22: qty 4

## 2014-05-22 MED ORDER — LIDOCAINE HCL (PF) 1 % IJ SOLN
INTRAMUSCULAR | Status: AC
  Administered 2014-05-22: 5 mL
  Filled 2014-05-22: qty 5

## 2014-05-22 MED ORDER — CEFTRIAXONE SODIUM 250 MG IJ SOLR
250.0000 mg | Freq: Once | INTRAMUSCULAR | Status: AC
  Administered 2014-05-22: 250 mg via INTRAMUSCULAR
  Filled 2014-05-22: qty 250

## 2014-05-22 NOTE — ED Provider Notes (Signed)
CSN: [id number]     Arrival date & time 05/22/14  0430 History   First MD Initiated Contact with Patient 05/22/14 [phone]     Chief Complaint  Patient presents with  . Vaginal Itching     (Consider location/radiation/quality/duration/timing/severity/associated sxs/prior Treatment) HPI  This is a 29 year old female with a one-day history of vulvovaginal irritation. Symptoms are moderate and worse with urination. There is associated itching and abnormal odor and slight discharge. She denies vaginal bleeding, fever, chills, nausea, vomiting or diarrhea.  History reviewed. No pertinent past medical history. History reviewed. No pertinent past surgical history. No family history on file. History  Substance Use Topics  . Smoking status: Current Every Day Smoker -- 0.50 packs/day    Types: Cigarettes  . Smokeless tobacco: Not on file  . Alcohol Use: Yes     Comment: pt reports occasional use   OB History    No data available     Review of Systems  All other systems reviewed and are negative.   Allergies  Review of patient's allergies indicates no known allergies.  Home Medications   Prior to Admission medications   Medication Sig Start Date End Date Taking? Authorizing Provider  acyclovir (ZOVIRAX) 400 MG tablet Take 1 tablet (400 mg total) by mouth 3 (three) times daily. 01/28/12   April K Palumbo-Rasch, MD  metoCLOPramide (REGLAN) 10 MG tablet Take 1 tablet (10 mg total) by mouth every 6 (six) hours as needed (nausea or headache). 03/23/12   Dione Booze, MD   BP 139/86 mmHg  Pulse 82  Temp(Src) 98.1 F (36.7 C) (Oral)  Resp 20  Ht  (1.626 m)  Wt 220 lb (99.791 kg)  BMI 37.74 kg/m2  SpO2 98%  LMP 05/06/2014   Physical Exam  General: Well-developed, well-nourished female in no acute distress; appearance consistent with age of record HENT: normocephalic; atraumatic Eyes: pupils equal, round and reactive to light; extraocular muscles intact Neck: supple Heart: regular  rate and rhythm Lungs: clear to auscultation bilaterally Abdomen: soft; nondistended; nontender; no masses or hepatosplenomegaly; bowel sounds present GU: Mild mucosal erythema; purulent discharge per cervical os; cervix erythematous; no cervical motion tenderness; no adnexal tenderness Extremities: No deformity; full range of motion; pulses normal Neurologic: Awake, alert and oriented; motor function intact in all extremities and symmetric; no facial droop Skin: Warm and dry Psychiatric: Normal mood and affect    ED Course  Procedures (including critical care time)   MDM  Nursing notes and vitals signs, including pulse oximetry, reviewed.  Summary of this visit's results, reviewed by myself:  Labs:  Results for orders placed or performed during the hospital encounter of 05/22/14 (from the past 24 hour(s))  Urinalysis, Routine w reflex microscopic     Status: Abnormal   Collection Time: 05/22/14  4:55 AM  Result Value Ref Range   Color, Urine YELLOW YELLOW   APPearance CLOUDY (A) CLEAR   Specific Gravity, Urine 1.018 1.005 - 1.030   pH 7.0 5.0 - 8.0   Glucose, UA NEGATIVE NEGATIVE mg/dL   Hgb urine dipstick SMALL (A) NEGATIVE   Bilirubin Urine NEGATIVE NEGATIVE   Ketones, ur NEGATIVE NEGATIVE mg/dL   Protein, ur NEGATIVE NEGATIVE mg/dL   Urobilinogen, UA 0.2 0.0 - 1.0 mg/dL   Nitrite NEGATIVE NEGATIVE   Leukocytes, UA MODERATE (A) NEGATIVE  Pregnancy, urine     Status: None   Collection Time: 05/22/14  4:55 AM  Result Value Ref Range   Preg Test, Ur NEGATIVE NEGATIVE  Urine microscopic-add on     Status: Abnormal   Collection Time: 05/22/14  4:55 AM  Result Value Ref Range   Squamous Epithelial / LPF MANY (A) RARE   WBC, UA 3-6 <3 WBC/hpf   RBC / HPF 0-2 <3 RBC/hpf   Bacteria, UA MANY (A) RARE  Wet prep, genital     Status: Abnormal   Collection Time: 05/22/14  5:00 AM  Result Value Ref Range   Yeast Wet Prep HPF POC NONE SEEN NONE SEEN   Trich, Wet Prep NONE SEEN  NONE SEEN   Clue Cells Wet Prep HPF POC FEW (A) NONE SEEN   WBC, Wet Prep HPF POC MANY (A) NONE SEEN   5:37 AM Exam consistent with early cervicitis. Will treat presumptively for GC and chlamydia.    Hanley SeamenJohn L Kristalynn Coddington, MD 05/22/14 [phone]

## 2014-05-22 NOTE — Discharge Instructions (Signed)
Cervicitis °Cervicitis is a soreness and swelling (inflammation) of the cervix. Your cervix is located at the bottom of your uterus. It opens up to the vagina. °CAUSES  °· Sexually transmitted infections (STIs).   °· Allergic reaction.   °· Medicines or birth control devices that are put in the vagina.   °· Injury to the cervix.   °· Bacterial infections.   °RISK FACTORS °You are at greater risk if you: °· Have unprotected sexual intercourse. °· Have sexual intercourse with many partners. °· Began sexual intercourse at an early age. °· Have a history of STIs. °SYMPTOMS  °There may be no symptoms. If symptoms occur, they may include:  °· Gray, white, yellow, or bad-smelling vaginal discharge.   °· Pain or itching of the area outside the vagina.   °· Painful sexual intercourse.   °· Lower abdominal or lower back pain, especially during intercourse.   °· Frequent urination.   °· Abnormal vaginal bleeding between periods, after sexual intercourse, or after menopause.   °· Pressure or a heavy feeling in the pelvis.   °DIAGNOSIS  °Diagnosis is made after a pelvic exam. Other tests may include:  °· Examination of any discharge under a microscope (wet prep).   °· A Pap test.   °TREATMENT  °Treatment will depend on the cause of cervicitis. If it is caused by an STI, both you and your partner will need to be treated. Antibiotic medicines will be given.  °HOME CARE INSTRUCTIONS  °· Do not have sexual intercourse until your health care provider says it is okay.   °· Do not have sexual intercourse until your partner has been treated, if your cervicitis is caused by an STI.   °· Take your antibiotics as directed. Finish them even if you start to feel better.   °SEEK MEDICAL CARE IF: °· Your symptoms come back.   °· You have a fever.   °MAKE SURE YOU:  °· Understand these instructions. °· Will watch your condition. °· Will get help right away if you are not doing well or get worse. °Document Released: 03/06/2005 Document Revised:  03/11/2013 Document Reviewed: 08/28/2012 °ExitCare® Patient Information ©2015 [employer]. This information is not intended to replace advice given to you by your health care provider. Make sure you discuss any questions you have with your health care provider. ° °

## 2014-05-22 NOTE — ED Notes (Signed)
Pt c/o vaginal irritating since yesterday, with itching, burning on urination with an odor; had un protective intercourse a wk ago.

## 2014-05-25 LAB — GC/CHLAMYDIA PROBE AMP (~~LOC~~) NOT AT ARMC
CHLAMYDIA, DNA PROBE: NEGATIVE
NEISSERIA GONORRHEA: NEGATIVE

## 2014-11-27 ENCOUNTER — Emergency Department (HOSPITAL_BASED_OUTPATIENT_CLINIC_OR_DEPARTMENT_OTHER)
Admission: EM | Admit: 2014-11-27 | Discharge: 2014-11-28 | Disposition: A | Discharge status: Home / Self Care | Payer: Self-pay | Attending: Emergency Medicine | Admitting: Emergency Medicine

## 2014-11-27 ENCOUNTER — Encounter (HOSPITAL_BASED_OUTPATIENT_CLINIC_OR_DEPARTMENT_OTHER): Payer: Self-pay | Admitting: *Deleted

## 2014-11-27 DIAGNOSIS — Z79899 Other long term (current) drug therapy: Secondary | ICD-10-CM | POA: Insufficient documentation

## 2014-11-27 DIAGNOSIS — Z720 Tobacco use: Secondary | ICD-10-CM | POA: Insufficient documentation

## 2014-11-27 DIAGNOSIS — Z3202 Encounter for pregnancy test, result negative: Secondary | ICD-10-CM | POA: Insufficient documentation

## 2014-11-27 DIAGNOSIS — N72 Inflammatory disease of cervix uteri: Secondary | ICD-10-CM

## 2014-11-27 LAB — URINE MICROSCOPIC-ADD ON

## 2014-11-27 LAB — URINALYSIS, ROUTINE W REFLEX MICROSCOPIC
BILIRUBIN URINE: NEGATIVE
GLUCOSE, UA: NEGATIVE mg/dL
KETONES UR: NEGATIVE mg/dL
Nitrite: NEGATIVE
PH: 7 (ref 5.0–8.0)
Protein, ur: NEGATIVE mg/dL
Specific Gravity, Urine: 1.028 (ref 1.005–1.030)
Urobilinogen, UA: 1 mg/dL (ref 0.0–1.0)

## 2014-11-27 LAB — PREGNANCY, URINE: Preg Test, Ur: NEGATIVE

## 2014-11-27 NOTE — ED Notes (Signed)
Abdominal pain. Vaginal discharge.

## 2014-11-28 ENCOUNTER — Encounter (HOSPITAL_BASED_OUTPATIENT_CLINIC_OR_DEPARTMENT_OTHER): Payer: Self-pay | Admitting: Emergency Medicine

## 2014-11-28 LAB — WET PREP, GENITAL
CLUE CELLS WET PREP: NONE SEEN
TRICH WET PREP: NONE SEEN

## 2014-11-28 MED ORDER — KETOROLAC TROMETHAMINE 60 MG/2ML IM SOLN
60.0000 mg | Freq: Once | INTRAMUSCULAR | Status: AC
  Administered 2014-11-28: 60 mg via INTRAMUSCULAR
  Filled 2014-11-28: qty 2

## 2014-11-28 MED ORDER — LIDOCAINE HCL (PF) 1 % IJ SOLN
INTRAMUSCULAR | Status: AC
  Administered 2014-11-28: 02:00:00
  Filled 2014-11-28: qty 5

## 2014-11-28 MED ORDER — CEFTRIAXONE SODIUM 250 MG IJ SOLR
250.0000 mg | Freq: Once | INTRAMUSCULAR | Status: AC
  Administered 2014-11-28: 250 mg via INTRAMUSCULAR
  Filled 2014-11-28: qty 250

## 2014-11-28 MED ORDER — AZITHROMYCIN 1 G PO PACK
1.0000 g | PACK | Freq: Once | ORAL | Status: AC
  Administered 2014-11-28: 1 g via ORAL
  Filled 2014-11-28: qty 1

## 2014-11-28 NOTE — Discharge Instructions (Signed)
Cervicitis °Cervicitis is a soreness and puffiness (inflammation) of the cervix.  °HOME CARE °· Do not have sex (intercourse) until your doctor says it is okay. °· Do not have sex until your partner is treated or as told by your doctor. °· Take your antibiotic medicine as told. Finish it even if you start to feel better. °GET HELP IF:  °· Your symptoms that brought you to the doctor come back. °· You have a fever. °MAKE SURE YOU:  °· Understand these instructions. °· Will watch your condition. °· Will get help right away if you are not doing well or get worse. °Document Released: 12/14/2007 Document Revised: 03/11/2013 Document Reviewed: 08/28/2012 °ExitCare® Patient Information ©2015 [employer]. This information is not intended to replace advice given to you by your health care provider. Make sure you discuss any questions you have with your health care provider. ° °

## 2014-11-28 NOTE — ED Notes (Signed)
Pt reports vaginal discharge and itching since last night 11/26/14.

## 2014-11-28 NOTE — ED Provider Notes (Signed)
CSN: [id number]     Arrival date & time 11/27/14  2124 History   First MD Initiated Contact with Patient 11/28/14 0022     Chief Complaint  Patient presents with  . Abdominal Pain     (Consider location/radiation/quality/duration/timing/severity/associated sxs/prior Treatment) Patient is a 29 y.o. female presenting with vaginal discharge. The history is provided by the patient.  Vaginal Discharge Quality:  Unable to specify Severity:  Moderate Onset quality:  Gradual Timing:  Constant Progression:  Unchanged Chronicity:  New Context: at rest   Relieved by:  Nothing Worsened by:  Nothing tried Ineffective treatments:  None tried Associated symptoms: vaginal itching   Associated symptoms: no genital lesions   Risk factors: unprotected sex     History reviewed. No pertinent past medical history. History reviewed. No pertinent past surgical history. History reviewed. No pertinent family history. Social History  Substance Use Topics  . Smoking status: Current Every Day Smoker -- 0.50 packs/day    Types: Cigarettes  . Smokeless tobacco: None  . Alcohol Use: Yes     Comment: pt reports occasional use   OB History    No data available     Review of Systems  Genitourinary: Positive for vaginal discharge.  All other systems reviewed and are negative.     Allergies  Review of patient's allergies indicates no known allergies.  Home Medications   Prior to Admission medications   Medication Sig Start Date End Date Taking? Authorizing Provider  acyclovir (ZOVIRAX) 400 MG tablet Take 1 tablet (400 mg total) by mouth 3 (three) times daily. 01/28/12   Janasia Coverdale, MD  metoCLOPramide (REGLAN) 10 MG tablet Take 1 tablet (10 mg total) by mouth every 6 (six) hours as needed (nausea or headache). 03/23/12   Dione Booze, MD   BP 156/98 mmHg  Pulse 74  Temp(Src) 98.7 F (37.1 C) (Oral)  Resp 18  Ht  (1.626 m)  Wt 220 lb (99.791 kg)  BMI 37.74 kg/m2  SpO2 98%  LMP  11/20/2014 Physical Exam  Constitutional: She is oriented to person, place, and time. She appears well-developed and well-nourished. No distress.  HENT:  Head: Normocephalic and atraumatic.  Mouth/Throat: Oropharynx is clear and moist.  Eyes: Conjunctivae are normal. Pupils are equal, round, and reactive to light.  Neck: Normal range of motion. Neck supple.  Cardiovascular: Normal rate, regular rhythm and intact distal pulses.   Pulmonary/Chest: Effort normal and breath sounds normal. No respiratory distress. She has no wheezes. She has no rales.  Abdominal: Soft. Bowel sounds are normal. There is no tenderness. There is no rebound and no guarding.  Genitourinary: Cervix exhibits motion tenderness and discharge. Vaginal discharge found.  Chaperone present, brownish gray discharge moderate mild CMT  Musculoskeletal: Normal range of motion.  Neurological: She is alert and oriented to person, place, and time.  Skin: Skin is warm and dry.  Psychiatric: She has a normal mood and affect.    ED Course  Procedures (including critical care time) Labs Review Labs Reviewed  URINALYSIS, ROUTINE W REFLEX MICROSCOPIC (NOT AT [facility]) - Abnormal; Notable for the following:    APPearance CLOUDY (*)    Hgb urine dipstick TRACE (*)    Leukocytes, UA MODERATE (*)    All other components within normal limits  WET PREP, GENITAL  PREGNANCY, URINE  URINE MICROSCOPIC-ADD ON  GC/CHLAMYDIA PROBE AMP ([place]) NOT AT [facility]    Imaging Review No results found. I have personally reviewed and evaluated these images and  lab results as part of my medical decision-making.   EKG Interpretation None      MDM   Final diagnoses:  None    Wet prep and exam along with history are highly suspicious for STI, will treat for GC and chlamydia.  No sexual activity for 7 days until  all partners are treated.  Patient verbalizes understanding     Leilana Mcquire, MD 11/28/14 [phone]

## 2014-11-30 LAB — GC/CHLAMYDIA PROBE AMP (~~LOC~~) NOT AT ARMC
CHLAMYDIA, DNA PROBE: NEGATIVE
NEISSERIA GONORRHEA: NEGATIVE

## 2017-06-11 ENCOUNTER — Encounter (HOSPITAL_BASED_OUTPATIENT_CLINIC_OR_DEPARTMENT_OTHER): Payer: Self-pay | Admitting: Emergency Medicine

## 2017-06-11 ENCOUNTER — Other Ambulatory Visit: Payer: Self-pay

## 2017-06-11 ENCOUNTER — Emergency Department (HOSPITAL_BASED_OUTPATIENT_CLINIC_OR_DEPARTMENT_OTHER)
Admission: EM | Admit: 2017-06-11 | Discharge: 2017-06-11 | Disposition: A | Discharge status: Home / Self Care | Payer: Self-pay | Attending: Emergency Medicine | Admitting: Emergency Medicine

## 2017-06-11 DIAGNOSIS — H6692 Otitis media, unspecified, left ear: Secondary | ICD-10-CM | POA: Insufficient documentation

## 2017-06-11 DIAGNOSIS — Z79899 Other long term (current) drug therapy: Secondary | ICD-10-CM | POA: Insufficient documentation

## 2017-06-11 DIAGNOSIS — H6982 Other specified disorders of Eustachian tube, left ear: Secondary | ICD-10-CM | POA: Insufficient documentation

## 2017-06-11 DIAGNOSIS — F17210 Nicotine dependence, cigarettes, uncomplicated: Secondary | ICD-10-CM | POA: Insufficient documentation

## 2017-06-11 MED ORDER — FLUTICASONE PROPIONATE 50 MCG/ACT NA SUSP: 2.0000 | Freq: Every day | NASAL | 0 refills | Status: DC

## 2017-06-11 MED ORDER — AMOXICILLIN 500 MG PO CAPS: 500.0000 mg | ORAL_CAPSULE | Freq: Two times a day (BID) | ORAL | 0 refills | Status: AC

## 2017-06-11 MED ORDER — CETIRIZINE HCL 10 MG PO TABS: 10.0000 mg | ORAL_TABLET | Freq: Every day | ORAL | 0 refills | Status: DC

## 2017-06-11 NOTE — Discharge Instructions (Signed)
Please take full course of antibiotics as directed.  Please also use Zyrtec and Flonase to help with nasal congestion.  Follow-up with [facility] for recheck.  Please return to the ED if you began having significantly worsening pain in the ear, persistent fevers, headache and neck pain or any other new or concerning symptoms.

## 2017-06-11 NOTE — ED Provider Notes (Signed)
[facility] EMERGENCY DEPARTMENT Provider Note   CSN: [id number] Arrival date & time: 06/11/17  1625     History   Chief Complaint Chief Complaint  Patient presents with  . Otalgia    HPI Leah Good is a 32 y.o. female.  Leah Good is a 32 y.o. Female who is otherwise healthy, presents to the ED for evaluation of left ear pain and difficulty hearing for the past week.  Patient reports pain is intermittent, throbbing in nature, and hearing sounds muffled.  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  Patient denies any fevers or chills, no drainage from the ear.  She does report she has had some nasal congestion and rhinorrhea, think she may have some seasonal allergies.  She has not taken any medications to treat her symptoms.  She denies any cough, no headaches or neck pain.  She denies any other aggravating or alleviating factors.     History reviewed. No pertinent past medical history.  There are no active problems to display for this patient.   History reviewed. No pertinent surgical history.   OB History   None      Home Medications    Prior to Admission medications   Medication Sig Start Date End Date Taking? Authorizing Provider  acyclovir (ZOVIRAX) 400 MG tablet Take 1 tablet (400 mg total) by mouth 3 (three) times daily. 01/28/12   Palumbo, April, MD  metoCLOPramide (REGLAN) 10 MG tablet Take 1 tablet (10 mg total) by mouth every 6 (six) hours as needed (nausea or headache). 03/23/12   Dione BoozeGlick, David, MD    Family History History reviewed. No pertinent family history.  Social History Social History   Tobacco Use  . Smoking status: Current Every Day Smoker    Packs/day: 0.50    Types: Cigarettes  Substance Use Topics  . Alcohol use: Yes    Comment: pt reports occasional use  . Drug use: Yes    Types: Marijuana     Allergies   Patient has no known allergies.   Review  of Systems Review of Systems  Constitutional: Negative for chills and fever.  HENT: Positive for congestion, ear pain, hearing loss, postnasal drip and sinus pressure. Negative for ear discharge and tinnitus.   Respiratory: Negative for cough and shortness of breath.   Cardiovascular: Negative for chest pain.  Musculoskeletal: Negative for neck pain and neck stiffness.  Skin: Negative for color change and rash.  Neurological: Negative for dizziness, light-headedness and headaches.     Physical Exam Updated Vital Signs BP (!) 141/91 (BP Location: Right Arm)   Pulse 70   Temp 98.3 F (36.8 C) (Oral)   Resp 18   Ht 5\' 4"  (1.626 m)   Wt 103.8 kg (228 lb 13.4 oz)   LMP 06/10/2017 (Approximate)   SpO2 100%   BMI 39.28 kg/m   Physical Exam  Constitutional: She appears well-developed and well-nourished. No distress.  HENT:  Head: Normocephalic and atraumatic.  Left TM erythematous and bulging with air-fluid level, EAC clear and unremarkable, no mastoid erythema or tenderness.  Right TM unremarkable.  Mild nasal mucosal edema and congestion.  Posterior oropharynx clear and moist.  Eyes: Right eye exhibits no discharge. Left eye exhibits no discharge.  Neck: Neck supple.  No rigidity  Cardiovascular: Normal rate, regular rhythm and normal heart sounds.  Pulmonary/Chest: Effort normal and breath sounds normal. No respiratory distress.  Lymphadenopathy:  She has no cervical adenopathy.  Neurological: She is alert. Coordination normal.  Skin: She is not diaphoretic.  Psychiatric: She has a normal mood and affect. Her behavior is normal.  Nursing note and vitals reviewed.    ED Treatments / Results  Labs (all labs ordered are listed, but only abnormal results are displayed) Labs Reviewed - No data to display  EKG None  Radiology No results found.  Procedures Procedures (including critical care time)  Medications Ordered in ED Medications - No data to  display   Initial Impression / Assessment and Plan / ED Course  I have reviewed the triage vital signs and the nursing notes.  Pertinent labs & imaging results that were available during my care of the patient were reviewed by me and considered in my medical decision making (see chart for details).  Pt presents to the ED for 1 week of left ear pain and hearing patient reports symptoms in the left ear, no drainage.  Patient also reports some nasal congestion.. On exam patient with normal vitals, consistent with left otitis media, no evidence of mastoiditis.  No concern for meningitis.  Will treat with amoxicillin, patient also likely has some degree of eustachian tube dysfunction and congestion.  Will have patient begin taking Zyrtec and Flonase daily.  Patient expresses understanding and agreement with plan.  Return precautions discussed.  Patient to follow-up with your primary doctor.   Final Clinical Impressions(s) / ED Diagnoses   Final diagnoses:  Left otitis media, unspecified otitis media type  Dysfunction of left eustachian tube    ED Discharge Orders        Ordered    amoxicillin (AMOXIL) 500 MG capsule  2 times daily     06/11/17 2109    fluticasone (FLONASE) 50 MCG/ACT nasal spray  Daily     06/11/17 2109    cetirizine (ZYRTEC ALLERGY) 10 MG tablet  Daily     06/11/17 2109       Dartha Lodge, PA-C 06/12/17 1610    Rolland Porter, MD 06/12/17 1520

## 2017-06-11 NOTE — ED Triage Notes (Signed)
Patient reports left ear pain with difficulty hearing which began last week.

## 2021-01-03 ENCOUNTER — Encounter (HOSPITAL_BASED_OUTPATIENT_CLINIC_OR_DEPARTMENT_OTHER): Payer: Self-pay | Admitting: Emergency Medicine

## 2021-01-03 ENCOUNTER — Other Ambulatory Visit: Payer: Self-pay

## 2021-01-03 ENCOUNTER — Emergency Department (HOSPITAL_BASED_OUTPATIENT_CLINIC_OR_DEPARTMENT_OTHER)
Admission: EM | Admit: 2021-01-03 | Discharge: 2021-01-03 | Disposition: A | Discharge status: Home / Self Care | Payer: Medicaid - Out of State | Attending: Emergency Medicine | Admitting: Emergency Medicine

## 2021-01-03 ENCOUNTER — Other Ambulatory Visit (HOSPITAL_BASED_OUTPATIENT_CLINIC_OR_DEPARTMENT_OTHER): Payer: Self-pay

## 2021-01-03 DIAGNOSIS — N760 Acute vaginitis: Secondary | ICD-10-CM | POA: Diagnosis not present

## 2021-01-03 DIAGNOSIS — B9689 Other specified bacterial agents as the cause of diseases classified elsewhere: Secondary | ICD-10-CM | POA: Diagnosis not present

## 2021-01-03 DIAGNOSIS — R109 Unspecified abdominal pain: Secondary | ICD-10-CM

## 2021-01-03 DIAGNOSIS — R110 Nausea: Secondary | ICD-10-CM | POA: Insufficient documentation

## 2021-01-03 DIAGNOSIS — R1030 Lower abdominal pain, unspecified: Secondary | ICD-10-CM | POA: Diagnosis present

## 2021-01-03 DIAGNOSIS — F17210 Nicotine dependence, cigarettes, uncomplicated: Secondary | ICD-10-CM | POA: Diagnosis not present

## 2021-01-03 LAB — URINALYSIS, ROUTINE W REFLEX MICROSCOPIC
Bilirubin Urine: NEGATIVE
Glucose, UA: NEGATIVE mg/dL
Hgb urine dipstick: NEGATIVE
Ketones, ur: NEGATIVE mg/dL
Nitrite: NEGATIVE
Protein, ur: NEGATIVE mg/dL
Specific Gravity, Urine: 1.025 (ref 1.005–1.030)
pH: 6 (ref 5.0–8.0)

## 2021-01-03 LAB — COMPREHENSIVE METABOLIC PANEL
ALT: 18 U/L (ref 0–44)
AST: 18 U/L (ref 15–41)
Albumin: 3.6 g/dL (ref 3.5–5.0)
Alkaline Phosphatase: 74 U/L (ref 38–126)
Anion gap: 5 (ref 5–15)
BUN: 12 mg/dL (ref 6–20)
CO2: 26 mmol/L (ref 22–32)
Calcium: 9.2 mg/dL (ref 8.9–10.3)
Chloride: 104 mmol/L (ref 98–111)
Creatinine, Ser: 0.8 mg/dL (ref 0.44–1.00)
GFR, Estimated: >60 mL/min (ref >60)
Glucose, Bld: 102 mg/dL — ABNORMAL HIGH (ref 70–99)
Potassium: 3.6 mmol/L (ref 3.5–5.1)
Sodium: 135 mmol/L (ref 135–145)
Total Bilirubin: 0.4 mg/dL (ref 0.3–1.2)
Total Protein: 7.4 g/dL (ref 6.5–8.1)

## 2021-01-03 LAB — CBC WITH DIFFERENTIAL/PLATELET
Abs Immature Granulocytes: 0.02 10*3/uL (ref 0.00–0.07)
Basophils Absolute: 0 10*3/uL (ref 0.0–0.1)
Basophils Relative: 0 %
Eosinophils Absolute: 0.1 10*3/uL (ref 0.0–0.5)
Eosinophils Relative: 1 %
HCT: 40.7 % (ref 36.0–46.0)
Hemoglobin: 13.3 g/dL (ref 12.0–15.0)
Immature Granulocytes: 0 %
Lymphocytes Relative: 25 %
Lymphs Abs: 1.7 10*3/uL (ref 0.7–4.0)
MCH: 29.8 pg (ref 26.0–34.0)
MCHC: 32.7 g/dL (ref 30.0–36.0)
MCV: 91.3 fL (ref 80.0–100.0)
Monocytes Absolute: 0.6 10*3/uL (ref 0.1–1.0)
Monocytes Relative: 9 %
Neutro Abs: 4.4 10*3/uL (ref 1.7–7.7)
Neutrophils Relative %: 65 %
Platelets: 283 10*3/uL (ref 150–400)
RBC: 4.46 MIL/uL (ref 3.87–5.11)
RDW: 13.1 % (ref 11.5–15.5)
WBC: 6.8 10*3/uL (ref 4.0–10.5)
nRBC: 0 % (ref 0.0–0.2)

## 2021-01-03 LAB — WET PREP, GENITAL
Sperm: NONE SEEN
Trich, Wet Prep: NONE SEEN
Yeast Wet Prep HPF POC: NONE SEEN

## 2021-01-03 LAB — URINALYSIS, MICROSCOPIC (REFLEX)

## 2021-01-03 LAB — PREGNANCY, URINE: Preg Test, Ur: NEGATIVE

## 2021-01-03 MED ORDER — LIDOCAINE HCL (PF) 1 % IJ SOLN
1.0000 mL | Freq: Once | INTRAMUSCULAR | Status: AC
  Administered 2021-01-03: 1 mL
  Filled 2021-01-03: qty 5

## 2021-01-03 MED ORDER — AZITHROMYCIN 250 MG PO TABS
1000.0000 mg | ORAL_TABLET | Freq: Once | ORAL | Status: AC
  Administered 2021-01-03: 1000 mg via ORAL
  Filled 2021-01-03: qty 4

## 2021-01-03 MED ORDER — ACETAMINOPHEN 325 MG PO TABS
650.0000 mg | ORAL_TABLET | Freq: Once | ORAL | Status: AC
  Administered 2021-01-03: 650 mg via ORAL
  Filled 2021-01-03: qty 2

## 2021-01-03 MED ORDER — CEFTRIAXONE SODIUM 500 MG IJ SOLR
500.0000 mg | Freq: Once | INTRAMUSCULAR | Status: AC
  Administered 2021-01-03: 500 mg via INTRAMUSCULAR
  Filled 2021-01-03: qty 500

## 2021-01-03 MED ORDER — METRONIDAZOLE 500 MG PO TABS
500.0000 mg | ORAL_TABLET | Freq: Two times a day (BID) | ORAL | 0 refills | Status: DC
  Filled 2021-01-03: qty 14, 7d supply, fill #0

## 2021-01-03 NOTE — Discharge Instructions (Addendum)
Do not have sex until your test results have returned negative. Call your primary care doctor or specialist as discussed in the next 2-3 days.   Return immediately back to the ER if:  Your symptoms worsen within the next 12-24 hours. You develop new symptoms such as new fevers, persistent vomiting, new pain, shortness of breath, or new weakness or numbness, or if you have any other concerns.

## 2021-01-03 NOTE — ED Provider Notes (Signed)
[facility] EMERGENCY DEPARTMENT Provider Note   CSN: [id number] Arrival date & time: 01/03/21  8242     History Chief Complaint  Patient presents with   Abdominal Pain    Leah Good is a 35 y.o. female.  Patient presents with lower abdominal aching pain and vaginal itching associate with nausea.  She vomited once yesterday but no vomiting today.  She describes abdominal pain is more of a discomfort, described as mild.  Denies any vaginal discharge or vaginal bleeding.  She sexually active with 1 partner with no prior history of STDs.      No past medical history on file.  There are no problems to display for this patient.   History reviewed. No pertinent surgical history.   OB History   No obstetric history on file.     No family history on file.  Social History   Tobacco Use   Smoking status: Every Day    Packs/day: 0.50    Types: Cigarettes  Substance Use Topics   Alcohol use: Yes    Comment: pt reports occasional use   Drug use: Yes    Types: Marijuana    Home Medications Prior to Admission medications   Medication Sig Start Date End Date Taking? Authorizing Provider  metroNIDAZOLE (FLAGYL) 500 MG tablet Take 1 tablet (500 mg total) by mouth 2 (two) times daily. 01/03/21  Yes Aarish Rockers, Eustace Moore, MD  acyclovir (ZOVIRAX) 400 MG tablet Take 1 tablet (400 mg total) by mouth 3 (three) times daily. 01/28/12   Palumbo, April, MD  cetirizine (ZYRTEC ALLERGY) 10 MG tablet Take 1 tablet (10 mg total) by mouth daily. 06/11/17   Dartha Lodge, PA-C  fluticasone (FLONASE) 50 MCG/ACT nasal spray Place 2 sprays into both nostrils daily. 06/11/17   Dartha Lodge, PA-C  metoCLOPramide (REGLAN) 10 MG tablet Take 1 tablet (10 mg total) by mouth every 6 (six) hours as needed (nausea or headache). 03/23/12   Dione Booze, MD    Allergies    Patient has no known allergies.  Review of Systems   Review of Systems  Constitutional:  Negative for fever.  HENT:   Negative for ear pain.   Eyes:  Negative for pain.  Respiratory:  Negative for cough.   Cardiovascular:  Negative for chest pain.  Gastrointestinal:  Positive for abdominal pain.  Genitourinary:  Negative for flank pain.  Musculoskeletal:  Negative for back pain.  Skin:  Negative for rash.  Neurological:  Negative for headaches.   Physical Exam Updated Vital Signs BP 123/78 (BP Location: Right Arm)   Pulse 67   Temp 98.4 F (36.9 C) (Oral)   Resp 16   Ht 5\' 4"  (1.626 m)   Wt 104.3 kg   LMP 12/15/2020   SpO2 100%   BMI 39.48 kg/m   Physical Exam Constitutional:      General: She is not in acute distress.    Appearance: Normal appearance.  HENT:     Head: Normocephalic.     Nose: Nose normal.  Eyes:     Extraocular Movements: Extraocular movements intact.  Cardiovascular:     Rate and Rhythm: Normal rate.  Pulmonary:     Effort: Pulmonary effort is normal.  Abdominal:     Tenderness: There is no abdominal tenderness. There is no guarding or rebound.  Genitourinary:    Comments: Pelvic exam performed with speculum with chaperone present at bedside.  Mild mucus discharge noted on exam.  No significant tenderness  on adnexal exam noted.  Q-tip sample sent for evaluation. Musculoskeletal:        General: Normal range of motion.     Cervical back: Normal range of motion.  Neurological:     General: No focal deficit present.     Mental Status: She is alert. Mental status is at baseline.    ED Results / Procedures / Treatments   Labs (all labs ordered are listed, but only abnormal results are displayed) Labs Reviewed  WET PREP, GENITAL - Abnormal; Notable for the following components:      Result Value   Clue Cells Wet Prep HPF POC PRESENT (*)    WBC, Wet Prep HPF POC MANY (*)    All other components within normal limits  COMPREHENSIVE METABOLIC PANEL - Abnormal; Notable for the following components:   Glucose, Bld 102 (*)    All other components within normal limits   URINALYSIS, ROUTINE W REFLEX MICROSCOPIC - Abnormal; Notable for the following components:   Leukocytes,Ua SMALL (*)    All other components within normal limits  URINALYSIS, MICROSCOPIC (REFLEX) - Abnormal; Notable for the following components:   Bacteria, UA FEW (*)    All other components within normal limits  CBC WITH DIFFERENTIAL/PLATELET  PREGNANCY, URINE  GC/CHLAMYDIA PROBE AMP ([place]) NOT AT [facility]    EKG None  Radiology No results found.  Procedures Procedures   Medications Ordered in ED Medications  cefTRIAXone (ROCEPHIN) injection 500 mg (has no administration in time range)  lidocaine (PF) (XYLOCAINE) 1 % injection 1 mL (has no administration in time range)  azithromycin (ZITHROMAX) tablet 1,000 mg (has no administration in time range)  acetaminophen (TYLENOL) tablet 650 mg (650 mg Oral Given 01/03/21 0730)    ED Course  I have reviewed the triage vital signs and the nursing notes.  Pertinent labs & imaging results that were available during my care of the patient were reviewed by me and considered in my medical decision making (see chart for details).    MDM Rules/Calculators/A&P                           Labs are unremarkable urinalysis shows only few bacteria.  Wet mount shows multiple clue cells.  Patient to be treated for bacterial vaginosis.  Risks and benefits of treatment discussed, given empiric treatment for gonorrhea chlamydia.  Advised outpatient follow-up with her OB/GYN or primary care doctor within the week, advising immediate return for fevers worsening pain increased discharge or any additional concerns.  Advised no sexual intercourse until test results are returned negative for she is cleared by her outpatient physicians.   Final Clinical Impression(s) / ED Diagnoses Final diagnoses:  Bacterial vaginosis  Abdominal pain, unspecified abdominal location    Rx / DC Orders ED Discharge Orders          Ordered    metroNIDAZOLE  (FLAGYL) 500 MG tablet  2 times daily        01/03/21 0941             Cheryll Cockayne, MD 01/03/21 [phone]

## 2021-01-03 NOTE — ED Triage Notes (Signed)
Reports lower abdominal pain, vaginal itching, nausea and one episode of vomiting that started two days ago. Also endorses diarrhea.  Has not taken any medications.

## 2021-01-04 LAB — GC/CHLAMYDIA PROBE AMP (~~LOC~~) NOT AT ARMC
Chlamydia: NEGATIVE
Comment: NEGATIVE
Comment: NORMAL
Neisseria Gonorrhea: NEGATIVE

## 2021-03-06 ENCOUNTER — Other Ambulatory Visit: Payer: Self-pay

## 2021-03-06 ENCOUNTER — Emergency Department (HOSPITAL_BASED_OUTPATIENT_CLINIC_OR_DEPARTMENT_OTHER)
Admission: EM | Admit: 2021-03-06 | Discharge: 2021-03-06 | Disposition: A | Discharge status: Home / Self Care | Payer: Medicaid - Out of State | Attending: Emergency Medicine | Admitting: Emergency Medicine

## 2021-03-06 ENCOUNTER — Encounter (HOSPITAL_BASED_OUTPATIENT_CLINIC_OR_DEPARTMENT_OTHER): Payer: Self-pay | Admitting: Emergency Medicine

## 2021-03-06 DIAGNOSIS — R102 Pelvic and perineal pain: Secondary | ICD-10-CM | POA: Insufficient documentation

## 2021-03-06 DIAGNOSIS — N898 Other specified noninflammatory disorders of vagina: Secondary | ICD-10-CM | POA: Diagnosis not present

## 2021-03-06 DIAGNOSIS — F17210 Nicotine dependence, cigarettes, uncomplicated: Secondary | ICD-10-CM | POA: Insufficient documentation

## 2021-03-06 LAB — WET PREP, GENITAL
Clue Cells Wet Prep HPF POC: NONE SEEN
Sperm: NONE SEEN
Trich, Wet Prep: NONE SEEN
WBC, Wet Prep HPF POC: <10 (ref <10)
Yeast Wet Prep HPF POC: NONE SEEN

## 2021-03-06 LAB — PREGNANCY, URINE: Preg Test, Ur: NEGATIVE

## 2021-03-06 MED ORDER — FLUCONAZOLE 150 MG PO TABS
150.0000 mg | ORAL_TABLET | Freq: Once | ORAL | Status: AC
  Administered 2021-03-06: 06:00:00 150 mg via ORAL
  Filled 2021-03-06: qty 1

## 2021-03-06 NOTE — ED Provider Notes (Signed)
[facility] Provider Note: Lowella Dell, MD, FACEP  CSN: [id number] MRN: [id number] ARRIVAL: 03/06/21 at 0507 ROOM: [facility]06/[facility]06   CHIEF COMPLAINT  Vaginal Discharge   HISTORY OF PRESENT ILLNESS  03/06/21 5:29 AM Leah Good is a 35 y.o. female with about 3 days of thick vaginal discharge and pelvic pain.  She thinks she may have a yeast infection and describes the discharge as white and creamy.  She has had some moderate discomfort in her pelvic region but cannot say if it is in the vulvovaginal area or higher.  She just started her menstrual cycle today.  For about a week she has had some abdominal cramping below her umbilicus.  This is been associated with loose stools.  History reviewed. No pertinent past medical history.  History reviewed. No pertinent surgical history.  No family history on file.  Social History   Tobacco Use   Smoking status: Every Day    Packs/day: 0.50    Types: Cigarettes  Substance Use Topics   Alcohol use: Yes    Comment: pt reports occasional use   Drug use: Yes    Types: Marijuana    Prior to Admission medications   Not on File    Allergies Patient has no known allergies.   REVIEW OF SYSTEMS  Negative except as noted here or in the History of Present Illness.   PHYSICAL EXAMINATION  Initial Vital Signs Blood pressure (!) 138/91, pulse 60, height 5\' 4"  (1.626 m), weight 102.1 kg, last menstrual period 03/06/2021, SpO2 98 %.  Examination General: Well-developed, well-nourished female in no acute distress; appearance consistent with age of record HENT: normocephalic; atraumatic Eyes: Normal appearance Neck: supple Heart: regular rate and rhythm Lungs: clear to auscultation bilaterally Abdomen: soft; nondistended; low suprapubic tenderness; bowel sounds present GU: Patient refused examination; vaginal swabs obtained by the patient herself Extremities: No deformity; full range of motion; pulses  normal Neurologic: Awake, alert and oriented; motor function intact in all extremities and symmetric; no facial droop Skin: Warm and dry Psychiatric: Normal mood and affect   RESULTS  Summary of this visit's results, reviewed and interpreted by myself:   EKG Interpretation  Date/Time:    Ventricular Rate:    PR Interval:    QRS Duration:   QT Interval:    QTC Calculation:   R Axis:     Text Interpretation:         Laboratory Studies: Results for orders placed or performed during the hospital encounter of 03/06/21 (from the past 24 hour(s))  Wet prep, genital     Status: None   Collection Time: 03/06/21  5:41 AM   Specimen: Genital  Result Value Ref Range   Yeast Wet Prep HPF POC NONE SEEN NONE SEEN   Trich, Wet Prep NONE SEEN NONE SEEN   Clue Cells Wet Prep HPF POC NONE SEEN NONE SEEN   WBC, Wet Prep HPF POC <10 <10   Sperm NONE SEEN   Pregnancy, urine     Status: None   Collection Time: 03/06/21  5:41 AM  Result Value Ref Range   Preg Test, Ur NEGATIVE NEGATIVE   Imaging Studies: No results found.  ED COURSE and MDM  Nursing notes, initial and subsequent vitals signs, including pulse oximetry, reviewed and interpreted by myself.  Vitals:   03/06/21 0519 03/06/21 0523  BP: (!) 138/91   Pulse: 60   Resp: 19   Temp: 98.5 F (36.9 C)   TempSrc: Oral  SpO2: 98%   Weight:  102.1 kg  Height:  5\' 4"  (1.626 m)   Medications  fluconazole (DIFLUCAN) tablet 150 mg (has no administration in time range)   We will treat for possible yeast infection as yeast does not always show up on a wet prep especially during menses.  The patient was advised that GC and Chlamydia testing is pending and will not result during this visit.  I suspect the crampy pain below her umbilicus is related to her loose stools and is of GI etiology.  The pelvic pain may be vulvovaginitis but as noted above the patient declined a pelvic exam.   PROCEDURES  Procedures   ED DIAGNOSES      ICD-10-CM   1. Vaginal discharge  N89.8          , MD 03/06/21 [phone]

## 2021-03-06 NOTE — ED Triage Notes (Signed)
Pt reports "a few days" of thick vaginal discharge and pelvic pain. Pt states " I think I have a yeast infection". She also mentions that she just started her menstrual cycle. She states she would like STD testing.

## 2021-03-07 LAB — GC/CHLAMYDIA PROBE AMP (~~LOC~~) NOT AT ARMC
Chlamydia: NEGATIVE
Comment: NEGATIVE
Comment: NORMAL
Neisseria Gonorrhea: NEGATIVE

## 2022-04-01 ENCOUNTER — Encounter (HOSPITAL_BASED_OUTPATIENT_CLINIC_OR_DEPARTMENT_OTHER): Payer: Self-pay

## 2022-04-01 ENCOUNTER — Other Ambulatory Visit: Payer: Self-pay

## 2022-04-01 ENCOUNTER — Emergency Department (HOSPITAL_BASED_OUTPATIENT_CLINIC_OR_DEPARTMENT_OTHER)
Admission: EM | Admit: 2022-04-01 | Discharge: 2022-04-01 | Disposition: A | Discharge status: Home / Self Care | Payer: Medicaid - Out of State | Attending: Emergency Medicine | Admitting: Emergency Medicine

## 2022-04-01 DIAGNOSIS — N898 Other specified noninflammatory disorders of vagina: Secondary | ICD-10-CM | POA: Insufficient documentation

## 2022-04-01 LAB — URINALYSIS, ROUTINE W REFLEX MICROSCOPIC
Bilirubin Urine: NEGATIVE
Glucose, UA: NEGATIVE mg/dL
Hgb urine dipstick: NEGATIVE
Ketones, ur: NEGATIVE mg/dL
Nitrite: NEGATIVE
Protein, ur: NEGATIVE mg/dL
Specific Gravity, Urine: 1.02 (ref 1.005–1.030)
pH: 7.5 (ref 5.0–8.0)

## 2022-04-01 LAB — WET PREP, GENITAL
Clue Cells Wet Prep HPF POC: NONE SEEN
Sperm: NONE SEEN
Trich, Wet Prep: NONE SEEN
WBC, Wet Prep HPF POC: <10 (ref <10)
Yeast Wet Prep HPF POC: NONE SEEN

## 2022-04-01 LAB — URINALYSIS, MICROSCOPIC (REFLEX)

## 2022-04-01 LAB — PREGNANCY, URINE: Preg Test, Ur: NEGATIVE

## 2022-04-01 NOTE — ED Provider Notes (Signed)
Leah Good   CSN: [id number] Arrival date & time: 04/01/22  0540     History  Chief Complaint  Patient presents with   Vaginal Discharge    Leah Good is a 37 y.o. female.  The history is provided by the patient.  Vaginal Discharge Leah Good is a 37 y.o. female who presents to the Emergency Department complaining of vaginal irritation.  She reports yesterday she developed vaginal irritation and discharge.  She does have an odor.  No dysuria.  No fever, nausea, vomiting.  No new sexual partners.  She does use condoms sometimes.  LMP December 29.   No known medical problems.  No medications.       Home Medications Prior to Admission medications   Not on File      Allergies    Patient has no known allergies.    Review of Systems   Review of Systems  Genitourinary:  Positive for vaginal discharge.  All other systems reviewed and are negative.   Physical Exam Updated Vital Signs BP 124/77   Pulse 74   Temp 98.7 F (37.1 C) (Oral)   Resp 18   Ht 5\' 4"  (1.626 m)   Wt 106.6 kg   LMP 03/17/2022 (Approximate)   SpO2 97%   BMI 40.34 kg/m  Physical Exam Vitals and nursing Good reviewed.  Constitutional:      Appearance: Normal appearance.  HENT:     Head: Normocephalic and atraumatic.  Cardiovascular:     Rate and Rhythm: Normal rate and regular rhythm.  Pulmonary:     Effort: Pulmonary effort is normal. No respiratory distress.  Abdominal:     Palpations: Abdomen is soft.     Tenderness: There is no abdominal tenderness. There is no guarding.  Genitourinary:    Comments: Scant white vaginal discharge without any vaginal lesions. Musculoskeletal:        General: No tenderness.  Skin:    General: Skin is warm and dry.  Neurological:     Mental Status: She is alert and oriented to person, place, and time.  Psychiatric:        Mood and Affect: Mood normal.     ED Results / Procedures / Treatments    Labs (all labs ordered are listed, but only abnormal results are displayed) Labs Reviewed  URINALYSIS, ROUTINE W REFLEX MICROSCOPIC - Abnormal; Notable for the following components:      Result Value   Leukocytes,Ua SMALL (*)    All other components within normal limits  URINALYSIS, MICROSCOPIC (REFLEX) - Abnormal; Notable for the following components:   Bacteria, UA RARE (*)    All other components within normal limits  WET PREP, GENITAL  PREGNANCY, URINE  GC/CHLAMYDIA PROBE AMP () NOT AT [facility]    EKG None  Radiology No results found.  Procedures Procedures    Medications Ordered in ED Medications - No data to display  ED Course/ Medical Decision Making/ A&P                             Medical Decision Making Amount and/or Complexity of Data Reviewed Labs: ordered.   Patient here for evaluation of vaginal itching and irritation for the last 24 hours.  Wet prep is negative for yeast, BV.  UA is not consistent with UTI.  There are no concerning lesions on external pelvic examination.  Current clinical picture is not consistent with  cervicitis, PID.  Discussed with patient home care for vaginal irritation.  Discussed outpatient follow-up and return precautions.        Final Clinical Impression(s) / ED Diagnoses Final diagnoses:  Vaginal itching    Rx / DC Orders ED Discharge Orders     None         Quintella Reichert, MD 04/01/22 [phone]

## 2022-04-01 NOTE — ED Notes (Signed)
Patient is self swabbing.

## 2022-04-01 NOTE — ED Triage Notes (Signed)
Patient reports vaginal irritation, odor, and discharge that began yesterday. No changes in sexual partner. No abdominal pain or pain with urination.

## 2022-04-03 LAB — GC/CHLAMYDIA PROBE AMP (~~LOC~~) NOT AT ARMC
Chlamydia: NEGATIVE
Comment: NEGATIVE
Comment: NORMAL
Neisseria Gonorrhea: NEGATIVE

## 2023-03-25 ENCOUNTER — Other Ambulatory Visit: Payer: Self-pay

## 2023-03-25 ENCOUNTER — Emergency Department (HOSPITAL_BASED_OUTPATIENT_CLINIC_OR_DEPARTMENT_OTHER)
Admission: EM | Admit: 2023-03-25 | Discharge: 2023-03-25 | Disposition: A | Discharge status: Home / Self Care | Payer: BC Managed Care – PPO | Attending: Emergency Medicine | Admitting: Emergency Medicine

## 2023-03-25 ENCOUNTER — Encounter (HOSPITAL_BASED_OUTPATIENT_CLINIC_OR_DEPARTMENT_OTHER): Payer: Self-pay | Admitting: Emergency Medicine

## 2023-03-25 DIAGNOSIS — N760 Acute vaginitis: Secondary | ICD-10-CM

## 2023-03-25 DIAGNOSIS — N898 Other specified noninflammatory disorders of vagina: Secondary | ICD-10-CM | POA: Insufficient documentation

## 2023-03-25 LAB — URINALYSIS, ROUTINE W REFLEX MICROSCOPIC
Bilirubin Urine: NEGATIVE
Glucose, UA: NEGATIVE mg/dL
Hgb urine dipstick: NEGATIVE
Ketones, ur: NEGATIVE mg/dL
Leukocytes,Ua: NEGATIVE
Nitrite: NEGATIVE
Protein, ur: NEGATIVE mg/dL
Specific Gravity, Urine: >=1.03 (ref 1.005–1.030)
pH: 6 (ref 5.0–8.0)

## 2023-03-25 LAB — PREGNANCY, URINE: Preg Test, Ur: NEGATIVE

## 2023-03-25 LAB — WET PREP, GENITAL
Clue Cells Wet Prep HPF POC: NONE SEEN
Sperm: NONE SEEN
Trich, Wet Prep: NONE SEEN
WBC, Wet Prep HPF POC: >=10 — AB (ref <10)

## 2023-03-25 LAB — CBG MONITORING, ED: Glucose-Capillary: 109 mg/dL — ABNORMAL HIGH (ref 70–99)

## 2023-03-25 MED ORDER — DOXYCYCLINE HYCLATE 100 MG PO CAPS: 100.0000 mg | ORAL_CAPSULE | Freq: Two times a day (BID) | ORAL | 0 refills | Status: AC

## 2023-03-25 MED ORDER — CEFTRIAXONE SODIUM 500 MG IJ SOLR
500.0000 mg | Freq: Once | INTRAMUSCULAR | Status: AC
  Administered 2023-03-25: 500 mg via INTRAMUSCULAR
  Filled 2023-03-25: qty 500

## 2023-03-25 MED ORDER — LIDOCAINE HCL (PF) 1 % IJ SOLN
INTRAMUSCULAR | Status: AC
  Administered 2023-03-25: 1 mL
  Filled 2023-03-25: qty 5

## 2023-03-25 MED ORDER — DOXYCYCLINE HYCLATE 100 MG PO TABS
100.0000 mg | ORAL_TABLET | Freq: Once | ORAL | Status: AC
Start: 2023-03-25 — End: 2023-03-25
  Administered 2023-03-25: 100 mg via ORAL
  Filled 2023-03-25: qty 1

## 2023-03-25 MED ORDER — FLUCONAZOLE 150 MG PO TABS
150.0000 mg | ORAL_TABLET | Freq: Once | ORAL | Status: AC
  Administered 2023-03-25: 150 mg via ORAL
  Filled 2023-03-25: qty 1

## 2023-03-25 NOTE — Discharge Instructions (Signed)
 Go back to your original soap.  Take the medication as prescribed for yeast infection as well as exposure to STD.  You will be called if your STD testing is positive.  Follow-up with the health department and your gynecologist.  Return to the ED with new or worsening symptoms.

## 2023-03-25 NOTE — ED Provider Notes (Signed)
 [place] [facility] AT [facility] [place] Provider Note   CSN: [id number] Arrival date & time: 03/25/23  0036     History  No chief complaint on file.   Leah Good is a 38 y.o. female.  Patient notes vaginal irritation for the past 3 to 4 days.  She believes this is due to using a new soap.  She believes she accidentally bought scented soap when she normally uses unscented.  Developed redness, irritation, itching and swelling to her vagina for the past several days since using the soap.  She has also had unprotected sex with her boyfriend but denies any concern for STD.  She does have some white vaginal discharge.  No bleeding.  No pain with urination or blood in the urine.  No fever, chills, nausea or vomiting.  No other known allergies.  No other rash.  The history is provided by the patient.       Home Medications Prior to Admission medications   Not on File      Allergies    Patient has no known allergies.    Review of Systems   Review of Systems  Constitutional:  Negative for activity change, appetite change and fever.  HENT:  Negative for congestion.   Respiratory:  Negative for cough, chest tightness and shortness of breath.   Gastrointestinal:  Negative for abdominal pain, nausea and vomiting.  Genitourinary:  Positive for vaginal discharge and vaginal pain. Negative for dysuria and hematuria.  Musculoskeletal:  Negative for arthralgias and myalgias.  Skin:  Negative for rash.  Neurological:  Negative for dizziness, weakness and headaches.   all other systems are negative except as noted in the HPI and PMH.    Physical Exam Updated Vital Signs BP (!) 141/91 (BP Location: Right Arm)   Pulse (!) 58   Temp 97.7 F (36.5 C) (Oral)   Resp 18   Ht 5' 4 (1.626 m)   Wt 104.3 kg   LMP 03/18/2023   SpO2 98%   BMI 39.48 kg/m  Physical Exam Vitals and nursing note reviewed.  Constitutional:      General: She is not in acute distress.     Appearance: She is well-developed.  HENT:     Head: Normocephalic and atraumatic.     Mouth/Throat:     Pharynx: No oropharyngeal exudate.  Eyes:     Conjunctiva/sclera: Conjunctivae normal.     Pupils: Pupils are equal, round, and reactive to light.  Neck:     Comments: No meningismus. Cardiovascular:     Rate and Rhythm: Normal rate and regular rhythm.     Heart sounds: Normal heart sounds. No murmur heard. Pulmonary:     Effort: Pulmonary effort is normal. No respiratory distress.     Breath sounds: Normal breath sounds.  Abdominal:     Palpations: Abdomen is soft.     Tenderness: There is no abdominal tenderness. There is no guarding or rebound.  Genitourinary:    Comments: Chaperone present Musician.  Normal external genitalia.  No visible rash or erythema.  White discharge in vaginal vault. Friable cervix Musculoskeletal:        General: No tenderness. Normal range of motion.     Cervical back: Normal range of motion and neck supple.  Skin:    General: Skin is warm.  Neurological:     Mental Status: She is alert and oriented to person, place, and time.     Cranial Nerves: No cranial nerve deficit.  Motor: No abnormal muscle tone.     Coordination: Coordination normal.     Comments:  5/5 strength throughout. CN 2-12 intact.Equal grip strength.   Psychiatric:        Behavior: Behavior normal.     ED Results / Procedures / Treatments   Labs (all labs ordered are listed, but only abnormal results are displayed) Labs Reviewed  WET PREP, GENITAL - Abnormal; Notable for the following components:      Result Value   Yeast Wet Prep HPF POC PRESENT (*)    WBC, Wet Prep HPF POC >=10 (*)    All other components within normal limits  CBG MONITORING, ED - Abnormal; Notable for the following components:   Glucose-Capillary 109 (*)    All other components within normal limits  URINALYSIS, ROUTINE W REFLEX MICROSCOPIC  PREGNANCY, URINE  GC/CHLAMYDIA PROBE AMP ([facility]) NOT AT [facility]    EKG None  Radiology No results found.  Procedures Procedures    Medications Ordered in ED Medications - No data to display  ED Course/ Medical Decision Making/ A&P                                 Medical Decision Making Amount and/or Complexity of Data Reviewed Labs: ordered. Decision-making details documented in ED Course. Radiology: ordered and independent interpretation performed. Decision-making details documented in ED Course. ECG/medicine tests: ordered and independent interpretation performed. Decision-making details documented in ED Course.  Risk Prescription drug management.   3 to 4 days of vaginal irritation.  Vital stable.  No distress.  Abdomen soft without peritoneal signs.  Urinalysis negative.  CBG negative.  CBG 109.  Suspect likely yeast infection.  May be chemical exposure from new soap.  Advised patient to go back to her original soap.  Patient also requesting empiric treatment for STDs.  She is given IM Rocephin  and p.o. doxycycline .  Discussed her partner should be treated as well.  Wet Prep with yeast.  Will treat with Diflucan .  Patient given empiric treatment with doxycycline  and Rocephin .  Follow-up with PCP as well as gynecologist.  Return precautions discussed.       Final Clinical Impression(s) / ED Diagnoses Final diagnoses:  None    Rx / DC Orders ED Discharge Orders     None         Railee Bonillas, Garnette, MD 03/25/23 [phone]

## 2023-03-25 NOTE — ED Triage Notes (Signed)
 Pt reports vaginal irritation x 3-4 days. She states she thinks it is her soap. She does state she has had unprotected sex, but she thinks the irritation is from soap. No changes to discharge.

## 2023-03-26 LAB — GC/CHLAMYDIA PROBE AMP (~~LOC~~) NOT AT ARMC
Chlamydia: POSITIVE — AB
Comment: NEGATIVE
Comment: NORMAL
Neisseria Gonorrhea: NEGATIVE

## 2023-10-15 ENCOUNTER — Other Ambulatory Visit: Payer: Self-pay
